# Patient Record
Sex: Female | Born: 1985 | Race: White | Hispanic: No | Marital: Married | Smoking: Current every day smoker
Health system: Southern US, Community
[De-identification: ages and names within clinical notes are randomized; demographics above are authoritative.]

## PROBLEM LIST (undated history)

## (undated) DIAGNOSIS — Z8619 Personal history of other infectious and parasitic diseases: Secondary | ICD-10-CM

## (undated) HISTORY — DX: Personal history of other infectious and parasitic diseases: Z86.19

## (undated) HISTORY — PX: MOUTH SURGERY: SHX715

---
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
## 2008-11-07 ENCOUNTER — Inpatient Hospital Stay (HOSPITAL_COMMUNITY): Admission: AD | Admit: 2008-11-07 | Discharge: 2008-11-07 | Payer: Self-pay | Admitting: Obstetrics and Gynecology

## 2008-11-08 ENCOUNTER — Inpatient Hospital Stay (HOSPITAL_COMMUNITY): Admission: AD | Admit: 2008-11-08 | Discharge: 2008-11-10 | Payer: Self-pay | Admitting: *Deleted

## 2010-04-18 LAB — CBC
HCT: 35.2 % — ABNORMAL LOW (ref 36.0–46.0)
Hemoglobin: 13.1 g/dL (ref 12.0–15.0)
MCHC: 34.7 g/dL (ref 30.0–36.0)
MCV: 95.3 fL (ref 78.0–100.0)
MCV: 95.8 fL (ref 78.0–100.0)
Platelets: 223 10*3/uL (ref 150–400)
RBC: 3.96 MIL/uL (ref 3.87–5.11)
RDW: 13.2 % (ref 11.5–15.5)

## 2010-08-12 DIAGNOSIS — Z30431 Encounter for routine checking of intrauterine contraceptive device: Secondary | ICD-10-CM

## 2010-11-26 LAB — OB RESULTS CONSOLE ANTIBODY SCREEN: Antibody Screen: NEGATIVE

## 2010-11-26 LAB — OB RESULTS CONSOLE HEPATITIS B SURFACE ANTIGEN: Hepatitis B Surface Ag: NEGATIVE

## 2010-11-26 LAB — OB RESULTS CONSOLE ABO/RH: RH Type: POSITIVE

## 2010-11-26 LAB — OB RESULTS CONSOLE RUBELLA ANTIBODY, IGM: Rubella: IMMUNE

## 2010-11-26 LAB — OB RESULTS CONSOLE RPR: RPR: NONREACTIVE

## 2011-01-14 NOTE — L&D Delivery Note (Signed)
Delivery Note At 4:54 AM a viable female was delivered via Vaginal, Spontaneous Delivery via OA  APGAR: 8 9    Placenta status: intact with 3 vessel cord  Cord:  with the following complications: tight nuchal cord x 1 Cord pH: not obtained  Anesthesia: Epidural  Episiotomy: None Lacerations: None Suture Repair: none Est. Blood Loss (mL): 300  Mom to postpartum.  Baby to nursery-stable.  Kailene Steinhart L 06/05/2011, 5:00 AM

## 2011-06-03 ENCOUNTER — Telehealth (HOSPITAL_COMMUNITY): Payer: Self-pay | Admitting: *Deleted

## 2011-06-03 ENCOUNTER — Encounter (HOSPITAL_COMMUNITY): Payer: Self-pay | Admitting: *Deleted

## 2011-06-03 NOTE — Telephone Encounter (Signed)
Preadmission screen  

## 2011-06-05 ENCOUNTER — Encounter (HOSPITAL_COMMUNITY): Payer: Self-pay | Admitting: *Deleted

## 2011-06-05 ENCOUNTER — Inpatient Hospital Stay (HOSPITAL_COMMUNITY): Admission: RE | Admit: 2011-06-05 | Payer: Medicaid Other | Source: Ambulatory Visit

## 2011-06-05 ENCOUNTER — Inpatient Hospital Stay (HOSPITAL_COMMUNITY): Payer: Medicaid Other | Admitting: Anesthesiology

## 2011-06-05 ENCOUNTER — Inpatient Hospital Stay (HOSPITAL_COMMUNITY)
Admission: AD | Admit: 2011-06-05 | Discharge: 2011-06-07 | DRG: 775 | Disposition: A | Discharge status: Home / Self Care | Payer: Medicaid Other | Source: Ambulatory Visit | Attending: Obstetrics and Gynecology | Admitting: Obstetrics and Gynecology

## 2011-06-05 ENCOUNTER — Encounter (HOSPITAL_COMMUNITY): Payer: Self-pay | Admitting: Anesthesiology

## 2011-06-05 LAB — CBC
MCHC: 34.9 g/dL (ref 30.0–36.0)
MCV: 92.1 fL (ref 78.0–100.0)
Platelets: 238 10*3/uL (ref 150–400)
RDW: 12.9 % (ref 11.5–15.5)
WBC: 14.7 10*3/uL — ABNORMAL HIGH (ref 4.0–10.5)

## 2011-06-05 MED ORDER — MEDROXYPROGESTERONE ACETATE 150 MG/ML IM SUSP: 150.0000 mg | INTRAMUSCULAR | Status: DC | PRN

## 2011-06-05 MED ORDER — FENTANYL 2.5 MCG/ML BUPIVACAINE 1/10 % EPIDURAL INFUSION (WH - ANES)
14.0000 mL/h | INTRAMUSCULAR | Status: DC
  Filled 2011-06-05: qty 60

## 2011-06-05 MED ORDER — ACETAMINOPHEN 325 MG PO TABS: 650.0000 mg | ORAL_TABLET | ORAL | Status: DC | PRN

## 2011-06-05 MED ORDER — IBUPROFEN 600 MG PO TABS
600.0000 mg | ORAL_TABLET | Freq: Four times a day (QID) | ORAL | Status: DC
  Administered 2011-06-05 – 2011-06-07 (×7): 600 mg via ORAL
  Filled 2011-06-05 (×7): qty 1

## 2011-06-05 MED ORDER — LACTATED RINGERS IV SOLN
INTRAVENOUS | Status: DC
  Administered 2011-06-05: 02:00:00 via INTRAVENOUS
  Administered 2011-06-05: 700 mL via INTRAVENOUS

## 2011-06-05 MED ORDER — ONDANSETRON HCL 4 MG PO TABS: 4.0000 mg | ORAL_TABLET | ORAL | Status: DC | PRN

## 2011-06-05 MED ORDER — SIMETHICONE 80 MG PO CHEW: 80.0000 mg | CHEWABLE_TABLET | ORAL | Status: DC | PRN

## 2011-06-05 MED ORDER — OXYCODONE-ACETAMINOPHEN 5-325 MG PO TABS: 1.0000 | ORAL_TABLET | ORAL | Status: DC | PRN

## 2011-06-05 MED ORDER — DIPHENHYDRAMINE HCL 25 MG PO CAPS: 25.0000 mg | ORAL_CAPSULE | Freq: Four times a day (QID) | ORAL | Status: DC | PRN

## 2011-06-05 MED ORDER — IBUPROFEN 600 MG PO TABS: 600.0000 mg | ORAL_TABLET | Freq: Four times a day (QID) | ORAL | Status: DC | PRN

## 2011-06-05 MED ORDER — SENNOSIDES-DOCUSATE SODIUM 8.6-50 MG PO TABS
2.0000 | ORAL_TABLET | Freq: Every day | ORAL | Status: DC
  Administered 2011-06-05 – 2011-06-06 (×2): 2 via ORAL

## 2011-06-05 MED ORDER — DIBUCAINE 1 % RE OINT: 1.0000 "application " | TOPICAL_OINTMENT | RECTAL | Status: DC | PRN

## 2011-06-05 MED ORDER — LIDOCAINE HCL (PF) 1 % IJ SOLN
30.0000 mL | INTRAMUSCULAR | Status: DC | PRN
  Filled 2011-06-05: qty 30

## 2011-06-05 MED ORDER — OXYCODONE-ACETAMINOPHEN 5-325 MG PO TABS
1.0000 | ORAL_TABLET | ORAL | Status: DC | PRN
  Administered 2011-06-05 – 2011-06-06 (×3): 1 via ORAL
  Filled 2011-06-05 (×2): qty 1

## 2011-06-05 MED ORDER — CITRIC ACID-SODIUM CITRATE 334-500 MG/5ML PO SOLN: 30.0000 mL | ORAL | Status: DC | PRN

## 2011-06-05 MED ORDER — BISACODYL 10 MG RE SUPP: 10.0000 mg | Freq: Every day | RECTAL | Status: DC | PRN

## 2011-06-05 MED ORDER — EPHEDRINE 5 MG/ML INJ
10.0000 mg | INTRAVENOUS | Status: DC | PRN
  Filled 2011-06-05: qty 4

## 2011-06-05 MED ORDER — LACTATED RINGERS IV SOLN: 500.0000 mL | Freq: Once | INTRAVENOUS | Status: DC

## 2011-06-05 MED ORDER — SODIUM BICARBONATE 8.4 % IV SOLN
INTRAVENOUS | Status: DC | PRN
  Administered 2011-06-05: 4 mL via EPIDURAL

## 2011-06-05 MED ORDER — FLEET ENEMA 7-19 GM/118ML RE ENEM: 1.0000 | ENEMA | Freq: Every day | RECTAL | Status: DC | PRN

## 2011-06-05 MED ORDER — ONDANSETRON HCL 4 MG/2ML IJ SOLN: 4.0000 mg | INTRAMUSCULAR | Status: DC | PRN

## 2011-06-05 MED ORDER — PRENATAL MULTIVITAMIN CH
1.0000 | ORAL_TABLET | Freq: Every day | ORAL | Status: DC
  Administered 2011-06-05 – 2011-06-06 (×2): 1 via ORAL
  Filled 2011-06-05 (×2): qty 1

## 2011-06-05 MED ORDER — WITCH HAZEL-GLYCERIN EX PADS: 1.0000 "application " | MEDICATED_PAD | CUTANEOUS | Status: DC | PRN

## 2011-06-05 MED ORDER — PHENYLEPHRINE 40 MCG/ML (10ML) SYRINGE FOR IV PUSH (FOR BLOOD PRESSURE SUPPORT)
80.0000 ug | PREFILLED_SYRINGE | INTRAVENOUS | Status: DC | PRN
  Filled 2011-06-05: qty 5

## 2011-06-05 MED ORDER — MEASLES, MUMPS & RUBELLA VAC ~~LOC~~ INJ
0.5000 mL | INJECTION | Freq: Once | SUBCUTANEOUS | Status: DC
  Filled 2011-06-05: qty 0.5

## 2011-06-05 MED ORDER — OXYTOCIN 20 UNITS IN LACTATED RINGERS INFUSION - SIMPLE: 125.0000 mL/h | Freq: Once | INTRAVENOUS | Status: DC

## 2011-06-05 MED ORDER — EPHEDRINE 5 MG/ML INJ: 10.0000 mg | INTRAVENOUS | Status: DC | PRN

## 2011-06-05 MED ORDER — ONDANSETRON HCL 4 MG/2ML IJ SOLN: 4.0000 mg | Freq: Four times a day (QID) | INTRAMUSCULAR | Status: DC | PRN

## 2011-06-05 MED ORDER — PHENYLEPHRINE 40 MCG/ML (10ML) SYRINGE FOR IV PUSH (FOR BLOOD PRESSURE SUPPORT): 80.0000 ug | PREFILLED_SYRINGE | INTRAVENOUS | Status: DC | PRN

## 2011-06-05 MED ORDER — OXYTOCIN BOLUS FROM INFUSION
500.0000 mL | Freq: Once | INTRAVENOUS | Status: DC
  Filled 2011-06-05: qty 500
  Filled 2011-06-05: qty 1000

## 2011-06-05 MED ORDER — DIPHENHYDRAMINE HCL 50 MG/ML IJ SOLN: 12.5000 mg | INTRAMUSCULAR | Status: DC | PRN

## 2011-06-05 MED ORDER — TETANUS-DIPHTH-ACELL PERTUSSIS 5-2.5-18.5 LF-MCG/0.5 IM SUSP
0.5000 mL | Freq: Once | INTRAMUSCULAR | Status: DC
  Filled 2011-06-05: qty 0.5

## 2011-06-05 MED ORDER — ZOLPIDEM TARTRATE 5 MG PO TABS: 5.0000 mg | ORAL_TABLET | Freq: Every evening | ORAL | Status: DC | PRN

## 2011-06-05 MED ORDER — LACTATED RINGERS IV SOLN: 500.0000 mL | INTRAVENOUS | Status: DC | PRN

## 2011-06-05 MED ORDER — FENTANYL 2.5 MCG/ML BUPIVACAINE 1/10 % EPIDURAL INFUSION (WH - ANES)
INTRAMUSCULAR | Status: DC | PRN
  Administered 2011-06-05: 14 mL/h via EPIDURAL

## 2011-06-05 MED ORDER — BENZOCAINE-MENTHOL 20-0.5 % EX AERO: 1.0000 "application " | INHALATION_SPRAY | CUTANEOUS | Status: DC | PRN

## 2011-06-05 MED ORDER — FLEET ENEMA 7-19 GM/118ML RE ENEM: 1.0000 | ENEMA | RECTAL | Status: DC | PRN

## 2011-06-05 MED ORDER — LANOLIN HYDROUS EX OINT: TOPICAL_OINTMENT | CUTANEOUS | Status: DC | PRN

## 2011-06-05 NOTE — Anesthesia Postprocedure Evaluation (Signed)
  Anesthesia Post-op Note  Patient: Megan Villanueva  Procedure(s) Performed: * No procedures listed *  Patient Location: [facility]  Anesthesia Type: Epidural  Level of Consciousness: awake  Airway and Oxygen Therapy: Patient Spontanous Breathing  Post-op Pain: none  Post-op Assessment: Patient's Cardiovascular Status Stable, Respiratory Function Stable, Patent Airway, No signs of Nausea or vomiting, Adequate PO intake, Pain level controlled, No headache, No backache, No residual numbness and No residual motor weakness  Post-op Vital Signs: Reviewed and stable  Complications: No apparent anesthesia complications

## 2011-06-05 NOTE — Anesthesia Procedure Notes (Signed)

## 2011-06-05 NOTE — MAU Note (Signed)
Pt G2P1 at 39.5wks contracting every 2-30min.  Denies any leaking, bleeding or problems with pregnancy.

## 2011-06-05 NOTE — Progress Notes (Signed)
UR Chart review completed.  

## 2011-06-05 NOTE — Anesthesia Preprocedure Evaluation (Signed)

## 2011-06-05 NOTE — H&P (Signed)
26 year old G 2 P 1001 at 30 w 5 days presented in active labor History of NSVD x 1 GBBS is negative PNC see [facility]  Afebrile  Vital signs stable General alert and oriented Lung CTAB Car RRR Abd Gravid Cervix 100%/10 +1 arom Clear fluid Vertex  IMPRESSION: Labor PLAN: NSVD

## 2011-06-06 LAB — CBC
HCT: 37.8 % (ref 36.0–46.0)
MCH: 31.3 pg (ref 26.0–34.0)
MCHC: 33.3 g/dL (ref 30.0–36.0)
MCV: 94 fL (ref 78.0–100.0)
Platelets: 207 10*3/uL (ref 150–400)
RDW: 13.2 % (ref 11.5–15.5)
WBC: 11.1 10*3/uL — ABNORMAL HIGH (ref 4.0–10.5)

## 2011-06-06 NOTE — Progress Notes (Signed)
Post Partum Day one Subjective: no complaints  Objective: Blood pressure 110/74, pulse 70, temperature 97.6 F (36.4 C), temperature source Oral, resp. rate 16, height 5\' 2"  (1.575 m), weight 80.287 kg (177 lb), last menstrual period 08/31/2010, SpO2 97.00%, unknown if currently breastfeeding.  Physical Exam:  General: alert Lochia: appropriate Uterine Fundus: firm Incision: healing well DVT Evaluation: No evidence of DVT seen on physical exam.   Basename 06/06/11 0455 06/05/11 0216  HGB 12.6 13.0  HCT 37.8 37.2    Assessment/Plan: Plan for discharge tomorrow   LOS: 1 day   Grayce Budden S 06/06/2011, 7:31 AM

## 2011-06-07 MED ORDER — PNEUMOCOCCAL VAC POLYVALENT 25 MCG/0.5ML IJ INJ
0.5000 mL | INJECTION | INTRAMUSCULAR | Status: AC
  Administered 2011-06-07: 0.5 mL via INTRAMUSCULAR
  Filled 2011-06-07: qty 0.5

## 2011-06-07 NOTE — Progress Notes (Signed)
Post Partum Day two Subjective: no complaints  Objective: Blood pressure 105/71, pulse 74, temperature 98.1 F (36.7 C), temperature source Oral, resp. rate 18, height 5\' 2"  (1.575 m), weight 80.287 kg (177 lb), last menstrual period 08/31/2010, SpO2 98.00%, unknown if currently breastfeeding.  Physical Exam:  General: alert Lochia: appropriate Uterine Fundus: firm Incision: healing well DVT Evaluation: No evidence of DVT seen on physical exam.   Basename 06/06/11 0455 06/05/11 0216  HGB 12.6 13.0  HCT 37.8 37.2    Assessment/Plan: Plan for discharge tomorrow and Discharge home   LOS: 2 days   Ashlon Lottman S 06/07/2011, 7:22 AM

## 2011-06-07 NOTE — Progress Notes (Signed)
Pt Dc'd to home with baby and family at 89.  DC instructions given and pt verbalized understanding of all. Pt stable and ambulated at her request with Loistine Chance NT.

## 2011-06-07 NOTE — Discharge Instructions (Signed)
Vaginal Delivery Care After  Change your pad on each trip to the bathroom.   Wipe gently with toilet paper during your hospital stay. Always wipe from front to back. A spray bottle with warm tap water could also be used or a towelette if available.   Place your soiled pad and toilet paper in a bathroom wastebasket with a plastic bag liner.   During your hospital stay, save any clots. If you pass a clot while on the toilet, do not flush it. Also, if your vaginal flow seems excessive to you, notify nursing personnel.   The first time you get out of bed after delivery, wait for assistance from a nurse. Do not get up alone at any time if you feel weak or dizzy.   Bend and extend your ankles forcefully so that you feel the calves of your legs get hard. Do this 6 times every hour when you are in bed and awake.   Do not sit with one foot under you, dangle your legs over the edge of the bed, or maintain a position that hinders the circulation in your legs.   Many women experience after pains for 2 to 3 days after delivery. These after pains are mild uterine contractions. Ask the nurse for a pain medication if you need something for this. Sometimes breastfeeding stimulates after pains; if you find this to be true, ask for the medication  -  hour before the next feeding.   For you and your infant's protection, do not go beyond the door(s) of the obstetric unit. Do not carry your baby in your arms in the hallway. When taking your baby to and from your room, put your baby in the bassinet and push the bassinet.   Mothers may have their babies in their room as much as they desire.  Document Released: 12/28/1999 Document Revised: 12/19/2010 Document Reviewed: 11/27/2006 Exit[employer] Patient Information 2012 [employer]. 

## 2011-06-07 NOTE — Discharge Summary (Signed)
Obstetric Discharge Summary Reason for Admission: onset of labor Prenatal Procedures: none Intrapartum Procedures: spontaneous vaginal delivery Postpartum Procedures: none Complications-Operative and Postpartum: none Hemoglobin  Date Value Range Status  06/06/2011 12.6  12.0-15.0 (g/dL) Final     HCT  Date Value Range Status  06/06/2011 37.8  36.0-46.0 (%) Final    Physical Exam:  General: alert Lochia: appropriate Uterine Fundus: firm Incision: healing well DVT Evaluation: No evidence of DVT seen on physical exam.  Discharge Diagnoses: Term Pregnancy-delivered  Discharge Information: Date: 06/07/2011 Activity: pelvic rest Diet: routine Medications: PNV Condition: stable Instructions: refer to practice specific booklet Discharge to: home   Newborn Data: Live born female  Birth Weight: 5 lb 11.4 oz (2590 g) APGAR: 8, 9  Home with mother.  Yzabella Crunk S 06/07/2011, 7:23 AM

## 2013-11-14 ENCOUNTER — Encounter (HOSPITAL_COMMUNITY): Payer: Self-pay | Admitting: *Deleted

## 2015-06-06 ENCOUNTER — Ambulatory Visit (HOSPITAL_COMMUNITY)
Admission: EM | Admit: 2015-06-06 | Discharge: 2015-06-06 | Disposition: A | Discharge status: Home / Self Care | Payer: Medicaid Other | Attending: Family Medicine | Admitting: Family Medicine

## 2015-06-06 ENCOUNTER — Encounter (HOSPITAL_COMMUNITY): Payer: Self-pay | Admitting: Emergency Medicine

## 2015-06-06 DIAGNOSIS — T148 Other injury of unspecified body region: Secondary | ICD-10-CM | POA: Diagnosis not present

## 2015-06-06 DIAGNOSIS — W57XXXA Bitten or stung by nonvenomous insect and other nonvenomous arthropods, initial encounter: Secondary | ICD-10-CM | POA: Diagnosis not present

## 2015-06-06 MED ORDER — FLUTICASONE PROPIONATE 0.05 % EX CREA: TOPICAL_CREAM | Freq: Two times a day (BID) | CUTANEOUS | Status: DC

## 2015-06-06 NOTE — ED Notes (Signed)
Rash/sores to face and right forearm, noticed 3-4 days ago.  Reports daughter treated for impetigo 2 weeks .  Reports rash is itchy

## 2015-06-06 NOTE — ED Provider Notes (Signed)
CSN: [id number]     Arrival date & time 06/06/15  1258 History   First MD Initiated Contact with Patient 06/06/15 1330     Chief Complaint  Patient presents with  . Rash   (Consider location/radiation/quality/duration/timing/severity/associated sxs/prior Treatment) Patient is a 30 y.o. female presenting with rash. The history is provided by the patient.  Rash Location:  Shoulder/arm and face Facial rash location:  Face Shoulder/arm rash location:  R forearm Severity:  Mild Onset quality:  Sudden Duration:  3 days Progression:  Unchanged Chronicity:  New Context: insect bite/sting   Relieved by:  None tried Worsened by:  Nothing tried Ineffective treatments:  None tried Associated symptoms: no fever     Past Medical History  Diagnosis Date  . History of chicken pox    Past Surgical History  Procedure Laterality Date  . Mouth surgery     Family History  Problem Relation Age of Onset  . Diabetes Maternal Grandmother    Social History  Substance Use Topics  . Smoking status: Current Every Day Smoker -- 0.30 packs/day  . Smokeless tobacco: Never Used  . Alcohol Use: No   OB History    Gravida Para Term Preterm AB TAB SAB Ectopic Multiple Living   2 2 2       2      Review of Systems  Constitutional: Negative.  Negative for fever.  Skin: Positive for rash.  All other systems reviewed and are negative.   Allergies  Review of patient's allergies indicates no known allergies.  Home Medications   Prior to Admission medications   Medication Sig Start Date End Date Taking? Authorizing Provider  fluticasone (CUTIVATE) 0.05 % cream Apply topically 2 (two) times daily. Sparingly. 06/06/15   Billy Fischer, MD  Prenatal Vit-Fe Fumarate-FA (PRENATAL MULTIVITAMIN) TABS Take 1 tablet by mouth daily.    Historical Provider, MD   Meds Ordered and Administered this Visit  Medications - No data to display  BP 125/98 mmHg  Pulse 92  Temp(Src) 98.2 F (36.8 C)  Resp 16   SpO2 99% No data found.   Physical Exam  Constitutional: She is oriented to person, place, and time. She appears well-developed and well-nourished. No distress.  Neurological: She is alert and oriented to person, place, and time.  Skin: Skin is warm and dry. Rash noted. There is erythema.  Papular excoriated rash on right cheek and right forearm, no infection.  Nursing note and vitals reviewed.   ED Course  Procedures (including critical care time)  Labs Review Labs Reviewed - No data to display  Imaging Review No results found.   Visual Acuity Review  Right Eye Distance:   Left Eye Distance:   Bilateral Distance:    Right Eye Near:   Left Eye Near:    Bilateral Near:         MDM   1. Multiple insect bites        Billy Fischer, MD 06/06/15 1336

## 2015-06-13 ENCOUNTER — Encounter (HOSPITAL_COMMUNITY): Payer: Self-pay | Admitting: Emergency Medicine

## 2015-06-13 ENCOUNTER — Ambulatory Visit (HOSPITAL_COMMUNITY)
Admission: EM | Admit: 2015-06-13 | Discharge: 2015-06-13 | Disposition: A | Discharge status: Home / Self Care | Payer: Medicaid Other | Attending: Emergency Medicine | Admitting: Emergency Medicine

## 2015-06-13 DIAGNOSIS — H6091 Unspecified otitis externa, right ear: Secondary | ICD-10-CM

## 2015-06-13 DIAGNOSIS — L0100 Impetigo, unspecified: Secondary | ICD-10-CM | POA: Diagnosis not present

## 2015-06-13 MED ORDER — NEOMYCIN-POLYMYXIN-HC 3.5-10000-1 OT SUSP: 4.0000 [drp] | Freq: Four times a day (QID) | OTIC | Status: DC

## 2015-06-13 MED ORDER — MUPIROCIN 2 % EX OINT: 1.0000 "application " | TOPICAL_OINTMENT | Freq: Two times a day (BID) | CUTANEOUS | Status: DC

## 2015-06-13 NOTE — Discharge Instructions (Signed)
Otitis Externa Otitis externa is a germ infection in the outer ear. The outer ear is the area from the eardrum to the outside of the ear. Otitis externa is sometimes called "swimmer's ear." HOME CARE  Put drops in the ear as told by your doctor.  Only take medicine as told by your doctor.  If you have diabetes, your doctor may give you more directions. Follow your doctor's directions.  Keep all doctor visits as told. To avoid another infection:  Keep your ear dry. Use the corner of a towel to dry your ear after swimming or bathing.  Avoid scratching or putting things inside your ear.  Avoid swimming in lakes, dirty water, or pools that use a chemical called chlorine poorly.  You may use ear drops after swimming. Combine equal amounts of white vinegar and alcohol in a bottle. Put 3 or 4 drops in each ear. GET HELP IF:   You have a fever.  Your ear is still red, puffy (swollen), or painful after 3 days.  You still have yellowish-white fluid (pus) coming from the ear after 3 days.  Your redness, puffiness, or pain gets worse.  You have a really bad headache.  You have redness, puffiness, pain, or tenderness behind your ear. MAKE SURE YOU:   Understand these instructions.  Will watch your condition.  Will get help right away if you are not doing well or get worse.   This information is not intended to replace advice given to you by your health care provider. Make sure you discuss any questions you have with your health care provider.   Document Released: 06/18/2007 Document Revised: 01/20/2014 Document Reviewed: 01/16/2011 Elsevier Interactive Patient Education 2016 [employer].  Use the mupirocin ointment twice a day on the rash.  Follow-up as needed.

## 2015-06-13 NOTE — ED Notes (Signed)
PT reports her right ear is very painful and she suspects it is infected. PT reports symptoms started four days ago. PT reports she believes that ear is leaking clear fluid. PT denies any recent head injury.

## 2015-06-13 NOTE — ED Provider Notes (Signed)
CSN: [id number]     Arrival date & time 06/13/15  1257 History   First MD Initiated Contact with Patient 06/13/15 1313     Chief Complaint  Patient presents with  . Otalgia   (Consider location/radiation/quality/duration/timing/severity/associated sxs/prior Treatment) HPI She is a 30 year old woman here for evaluation of right ear pain. She states this started about 4 days ago. It has gradually been getting worse. Yesterday and today she has noticed some drainage from the right ear. There is some associated nasal congestion.  No cough or fevers. No decreased hearing.  She also reports a continued rash for a week ago. She's been using a steroid cream on it with some improvement. The lesions are itchy and some of them have crusting.  Past Medical History  Diagnosis Date  . History of chicken pox    Past Surgical History  Procedure Laterality Date  . Mouth surgery     Family History  Problem Relation Age of Onset  . Diabetes Maternal Grandmother    Social History  Substance Use Topics  . Smoking status: Current Every Day Smoker -- 1.00 packs/day  . Smokeless tobacco: Never Used  . Alcohol Use: Yes     Comment: social   OB History    Gravida Para Term Preterm AB TAB SAB Ectopic Multiple Living   2 2 2       2      Review of Systems As in history of present illness Allergies  Review of patient's allergies indicates no known allergies.  Home Medications   Prior to Admission medications   Medication Sig Start Date End Date Taking? Authorizing Provider  fluticasone (CUTIVATE) 0.05 % cream Apply topically 2 (two) times daily. Sparingly. 06/06/15  Yes Billy Fischer, MD  mupirocin ointment (BACTROBAN) 2 % Apply 1 application topically 2 (two) times daily. 06/13/15   Melony Overly, MD  neomycin-polymyxin-hydrocortisone (CORTISPORIN) 3.5-10000-1 otic suspension Place 4 drops into the right ear 4 (four) times daily. for 1 week 06/13/15   Melony Overly, MD   Meds Ordered and Administered  this Visit  Medications - No data to display  BP 141/93 mmHg  Pulse 98  Temp(Src) 98.6 F (37 C) (Oral)  Resp 16  SpO2 100% No data found.   Physical Exam  Constitutional: She is oriented to person, place, and time. She appears well-developed and well-nourished. No distress.  HENT:  Left ear canal and TM are normal. Right ear canal is swollen and erythematous with copious purulent drainage. After irrigation, TM is normal.  Neck: Neck supple.  Cardiovascular: Normal rate.   Pulmonary/Chest: Effort normal.  Neurological: She is alert and oriented to person, place, and time.  Skin: Rash (multiple papular lesions on the face. Several of these have developed a honey-colored crusting.) noted.    ED Course  Procedures (including critical care time)  Labs Review Labs Reviewed - No data to display  Imaging Review No results found.   MDM   1. Right otitis externa   2. Impetigo    Cortisporin drops for the ear infection. Mupirocin for impetigo. Follow-up if not improving in 2 days.    Melony Overly, MD 06/13/15 1352

## 2015-08-03 ENCOUNTER — Encounter (HOSPITAL_COMMUNITY): Payer: Self-pay | Admitting: Emergency Medicine

## 2015-08-03 ENCOUNTER — Ambulatory Visit (HOSPITAL_COMMUNITY)
Admission: EM | Admit: 2015-08-03 | Discharge: 2015-08-03 | Disposition: A | Discharge status: Home / Self Care | Payer: Medicaid Other | Attending: Family Medicine | Admitting: Family Medicine

## 2015-08-03 DIAGNOSIS — L03211 Cellulitis of face: Secondary | ICD-10-CM

## 2015-08-03 MED ORDER — SULFAMETHOXAZOLE-TRIMETHOPRIM 800-160 MG PO TABS: 1.0000 | ORAL_TABLET | Freq: Two times a day (BID) | ORAL | Status: AC

## 2015-08-03 NOTE — ED Provider Notes (Signed)
CSN: [id number]     Arrival date & time 08/03/15  1251 History   None    Chief Complaint  Patient presents with  . Rash   (Consider location/radiation/quality/duration/timing/severity/associated sxs/prior Treatment) Patient is a 30 y.o. female presenting with rash. The history is provided by the patient.  Rash Location:  Face (right inner eye ) Facial rash location:  Face Quality: redness and swelling   Severity:  Mild Onset quality:  Gradual Duration:  6 days Timing:  Constant Progression:  Spreading Chronicity:  New Relieved by:  Nothing Ineffective treatments:  None tried   Past Medical History  Diagnosis Date  . History of chicken pox    Past Surgical History  Procedure Laterality Date  . Mouth surgery     Family History  Problem Relation Age of Onset  . Diabetes Maternal Grandmother    Social History  Substance Use Topics  . Smoking status: Current Every Day Smoker -- 1.00 packs/day  . Smokeless tobacco: Never Used  . Alcohol Use: Yes     Comment: social   OB History    Gravida Para Term Preterm AB TAB SAB Ectopic Multiple Living   2 2 2       2      Review of Systems  Constitutional: Negative.   HENT: Negative.   Eyes: Negative.   Respiratory: Negative.   Cardiovascular: Negative.   Gastrointestinal: Negative.   Endocrine: Negative.   Genitourinary: Negative.   Musculoskeletal: Negative.   Skin: Positive for rash.  Allergic/Immunologic: Negative.   Neurological: Negative.   Hematological: Negative.   Psychiatric/Behavioral: Negative.     Allergies  Review of patient's allergies indicates no known allergies.  Home Medications   Prior to Admission medications   Medication Sig Start Date End Date Taking? Authorizing Provider  fluticasone (CUTIVATE) 0.05 % cream Apply topically 2 (two) times daily. Sparingly. 06/06/15   Billy Fischer, MD  mupirocin ointment (BACTROBAN) 2 % Apply 1 application topically 2 (two) times daily. 06/13/15   Melony Overly, MD  neomycin-polymyxin-hydrocortisone (CORTISPORIN) 3.5-10000-1 otic suspension Place 4 drops into the right ear 4 (four) times daily. for 1 week 06/13/15   Melony Overly, MD  sulfamethoxazole-trimethoprim (BACTRIM DS,SEPTRA DS) 800-160 MG tablet Take 1 tablet by mouth 2 (two) times daily. 08/03/15 08/10/15  Lysbeth Penner, FNP   Meds Ordered and Administered this Visit  Medications - No data to display  BP 109/75 mmHg  Pulse 93  Temp(Src) 98.3 F (36.8 C) (Oral)  Resp 18  SpO2 97% No data found.   Physical Exam  Constitutional: She is oriented to person, place, and time. She appears well-developed and well-nourished.  HENT:  Head: Normocephalic.  Right Ear: External ear normal.  Left Ear: External ear normal.  Mouth/Throat: Oropharynx is clear and moist.  Eyes: Conjunctivae and EOM are normal. Pupils are equal, round, and reactive to light.  Neck: Normal range of motion. Neck supple.  Cardiovascular: Normal rate, regular rhythm and normal heart sounds.   Pulmonary/Chest: Effort normal and breath sounds normal.  Abdominal: Soft. Bowel sounds are normal.  Neurological: She is alert and oriented to person, place, and time.  Skin: There is erythema.  Left perioribital area with erythema and tenderness.    ED Course  Procedures (including critical care time)  Labs Review Labs Reviewed - No data to display  Imaging Review No results found.   Visual Acuity Review  Right Eye Distance:   Left Eye Distance:   Bilateral  Distance:    Right Eye Near:   Left Eye Near:    Bilateral Near:         MDM   1. Cellulitis of face   Bactrim DS one po bid x 10 days #20      Lysbeth Penner, FNP 08/03/15 1521

## 2015-08-03 NOTE — ED Notes (Signed)
The patient presented to the [facility] with a complaint of a rash on her face x 6 days.

## 2017-09-30 ENCOUNTER — Emergency Department (HOSPITAL_COMMUNITY)
Admission: EM | Admit: 2017-09-30 | Discharge: 2017-09-30 | Disposition: A | Discharge status: Home / Self Care | Payer: Self-pay | Attending: Emergency Medicine | Admitting: Emergency Medicine

## 2017-09-30 ENCOUNTER — Emergency Department (HOSPITAL_COMMUNITY): Payer: Self-pay

## 2017-09-30 ENCOUNTER — Other Ambulatory Visit: Payer: Self-pay

## 2017-09-30 ENCOUNTER — Encounter (HOSPITAL_COMMUNITY): Payer: Self-pay

## 2017-09-30 DIAGNOSIS — F17210 Nicotine dependence, cigarettes, uncomplicated: Secondary | ICD-10-CM | POA: Insufficient documentation

## 2017-09-30 DIAGNOSIS — D75839 Thrombocytosis, unspecified: Secondary | ICD-10-CM

## 2017-09-30 DIAGNOSIS — R0789 Other chest pain: Secondary | ICD-10-CM | POA: Insufficient documentation

## 2017-09-30 DIAGNOSIS — R030 Elevated blood-pressure reading, without diagnosis of hypertension: Secondary | ICD-10-CM | POA: Insufficient documentation

## 2017-09-30 DIAGNOSIS — D473 Essential (hemorrhagic) thrombocythemia: Secondary | ICD-10-CM | POA: Insufficient documentation

## 2017-09-30 LAB — BASIC METABOLIC PANEL
Anion gap: 12 (ref 5–15)
BUN: 10 mg/dL (ref 6–20)
CALCIUM: 9.5 mg/dL (ref 8.9–10.3)
CO2: 24 mmol/L (ref 22–32)
CREATININE: 0.74 mg/dL (ref 0.44–1.00)
Chloride: 104 mmol/L (ref 98–111)
GFR calc non Af Amer: >60 mL/min (ref >60)
Glucose, Bld: 121 mg/dL — ABNORMAL HIGH (ref 70–99)
Potassium: 3.6 mmol/L (ref 3.5–5.1)
Sodium: 140 mmol/L (ref 135–145)

## 2017-09-30 LAB — CBC
HCT: 49.7 % — ABNORMAL HIGH (ref 36.0–46.0)
Hemoglobin: 17.7 g/dL — ABNORMAL HIGH (ref 12.0–15.0)
MCH: 35.7 pg — ABNORMAL HIGH (ref 26.0–34.0)
MCHC: 35.6 g/dL (ref 30.0–36.0)
MCV: 100.2 fL — ABNORMAL HIGH (ref 78.0–100.0)
PLATELETS: 286 10*3/uL (ref 150–400)
RBC: 4.96 MIL/uL (ref 3.87–5.11)
RDW: 12.3 % (ref 11.5–15.5)
WBC: 10.1 10*3/uL (ref 4.0–10.5)

## 2017-09-30 LAB — POCT I-STAT TROPONIN I: TROPONIN I, POC: 0 ng/mL (ref 0.00–0.08)

## 2017-09-30 LAB — I-STAT BETA HCG BLOOD, ED (NOT ORDERABLE): I-stat hCG, quantitative: <5 m[IU]/mL (ref <5)

## 2017-09-30 NOTE — ED Provider Notes (Signed)
[facility] DEPT Provider Note   CSN: [id number] Arrival date & time: 09/30/17  1658     History   Chief Complaint Chief Complaint  Patient presents with  . Chest Pain    HPI Megan Villanueva is a 32 y.o. female.  The history is provided by the patient. No language interpreter was used.  Chest Pain     Megan Villanueva is a 32 y.o. female who presents to the Emergency Department complaining of chest pain. She presents to the emergency department complaining of chest pain that began two days ago. Pain is located along her sternum and radiates around to both ribs. It is waxing and waning and described as a soreness and a bone type pain. It is better when she lays down and worse with movement. She denies any fevers, cough, shortness of breath, abdominal pain, nausea, vomiting, leg swelling or pain. No known injuries and no similar prior symptoms. She smokes a pack a day. She does have a Mirena IUD. She has no personal or family history of blood clots or cardiac disease. Past Medical History:  Diagnosis Date  . History of chicken pox     There are no active problems to display for this patient.   Past Surgical History:  Procedure Laterality Date  . MOUTH SURGERY       OB History    Gravida  2   Para  2   Term  2   Preterm      AB      Living  2     SAB      TAB      Ectopic      Multiple      Live Births  2            Home Medications    Prior to Admission medications   Medication Sig Start Date End Date Taking? Authorizing Provider  acetaminophen (TYLENOL) 500 MG tablet Take 500 mg by mouth daily as needed (pain).   Yes [provider]  ibuprofen (ADVIL,MOTRIN) 200 MG tablet Take 200 mg by mouth daily as needed (chest pain).   Yes [provider]  ranitidine (ZANTAC) 75 MG tablet Take 150 mg by mouth daily as needed for heartburn.   Yes [provider]  fluticasone (CUTIVATE) 0.05 % cream  Apply topically 2 (two) times daily. Sparingly. Patient not taking: Reported on 09/30/2017 06/06/15   Billy Fischer, MD  mupirocin ointment (BACTROBAN) 2 % Apply 1 application topically 2 (two) times daily. Patient not taking: Reported on 09/30/2017 06/13/15   Melony Overly, MD  neomycin-polymyxin-hydrocortisone (CORTISPORIN) 3.5-10000-1 otic suspension Place 4 drops into the right ear 4 (four) times daily. for 1 week Patient not taking: Reported on 09/30/2017 06/13/15   Melony Overly, MD    Family History Family History  Problem Relation Age of Onset  . Diabetes Maternal Grandmother     Social History Social History   Tobacco Use  . Smoking status: Current Every Day Smoker    Packs/day: 1.00  . Smokeless tobacco: Never Used  Substance Use Topics  . Alcohol use: Yes    Comment: social  . Drug use: No     Allergies   Patient has no known allergies.   Review of Systems Review of Systems  Cardiovascular: Positive for chest pain.  All other systems reviewed and are negative.    Physical Exam Updated Vital Signs BP (!) 142/105 (BP Location: Left Arm)  Pulse 72   Temp 98 F (36.7 C) (Oral)   Resp 16   Ht 5\' 3"  (1.6 m)   Wt 74.8 kg   SpO2 100%   BMI 29.23 kg/m   Physical Exam  Constitutional: She is oriented to person, place, and time. She appears well-developed and well-nourished.  HENT:  Head: Normocephalic and atraumatic.  Cardiovascular: Normal rate and regular rhythm.  No murmur heard. Pulmonary/Chest: Effort normal and breath sounds normal. No respiratory distress.  Abdominal: Soft. There is no tenderness. There is no rebound and no guarding.  Musculoskeletal: She exhibits no edema or tenderness.  Neurological: She is alert and oriented to person, place, and time.  Skin: Skin is warm and dry.  Psychiatric: She has a normal mood and affect. Her behavior is normal.  Nursing note and vitals reviewed.    ED Treatments / Results  Labs (all labs ordered are  listed, but only abnormal results are displayed) Labs Reviewed  BASIC METABOLIC PANEL - Abnormal; Notable for the following components:      Result Value   Glucose, Bld 121 (*)    All other components within normal limits  CBC - Abnormal; Notable for the following components:   Hemoglobin 17.7 (*)    HCT 49.7 (*)    MCV 100.2 (*)    MCH 35.7 (*)    All other components within normal limits  I-STAT TROPONIN, ED  I-STAT BETA HCG BLOOD, ED ([facility], [facility], [facility] ONLY)  POCT I-STAT TROPONIN I  I-STAT BETA HCG BLOOD, ED (NOT ORDERABLE)    EKG EKG Interpretation  Date/Time:  Wednesday September 30 2017 20:16:50 EDT Ventricular Rate:  73 PR Interval:    QRS Duration: 96 QT Interval:  400 QTC Calculation: 441 R Axis:   58 Text Interpretation:  Sinus rhythm Low voltage, precordial leads Confirmed by Quintella Reichert [phone]) on 09/30/2017 8:18:57 PM   Radiology Dg Chest 2 View  Result Date: 09/30/2017 CLINICAL DATA:  Chest pain EXAM: CHEST - 2 VIEW COMPARISON:  None. FINDINGS: The heart size and mediastinal contours are within normal limits. Both lungs are clear. The visualized skeletal structures are unremarkable. IMPRESSION: No active cardiopulmonary disease. Electronically Signed   By: Donavan Foil M.D.   On: 09/30/2017 17:50    Procedures Procedures (including critical care time)  Medications Ordered in ED Medications - No data to display   Initial Impression / Assessment and Plan / ED Course  I have reviewed the triage vital signs and the nursing notes.  Pertinent labs & imaging results that were available during my care of the patient were reviewed by me and considered in my medical decision making (see chart for details).     Patient here for evaluation of two days of chest pain. She is well appearing on examination and in no acute distress. Presentation is not consistent with ACS, PE, pneumonia, dissection. She was hypotensive on ED arrival, improved on recheck of her blood  pressure. EKG without acute ischemic changes. CBC with thrombocytosis - question possibly related to tobacco use. Discussed with patient home care for atypical chest pain - musculoskeletal versus reflux related. Discussed smoking sensation. Discussed outpatient follow-up and return precautions.  Final Clinical Impressions(s) / ED Diagnoses   Final diagnoses:  Atypical chest pain  Thrombocytosis (HCC)  Elevated blood pressure reading without diagnosis of hypertension    ED Discharge Orders    None       Quintella Reichert, MD 09/30/17 2041

## 2017-09-30 NOTE — Discharge Instructions (Addendum)
Your red blood cell count was high today. This can happen from blood issues as well as from smoking. Please try to stop smoking. Please follow-up with your doctor for recheck. Get rechecked immediately if you have any new or concerning symptoms. You can take omeprazole, available over-the-counter for indigestion.

## 2017-09-30 NOTE — ED Triage Notes (Signed)
Pt states that for several days, she has had chest pain, mostly in her sternal area, slightly radiating to her back. Pt describes the pain as annoying and scary.

## 2017-10-16 ENCOUNTER — Emergency Department (HOSPITAL_COMMUNITY)
Admission: EM | Admit: 2017-10-16 | Discharge: 2017-10-16 | Disposition: A | Discharge status: Home / Self Care | Payer: Self-pay | Attending: Emergency Medicine | Admitting: Emergency Medicine

## 2017-10-16 ENCOUNTER — Emergency Department (HOSPITAL_COMMUNITY): Payer: Self-pay

## 2017-10-16 ENCOUNTER — Encounter (HOSPITAL_COMMUNITY): Payer: Self-pay | Admitting: Emergency Medicine

## 2017-10-16 ENCOUNTER — Other Ambulatory Visit: Payer: Self-pay

## 2017-10-16 DIAGNOSIS — M79632 Pain in left forearm: Secondary | ICD-10-CM | POA: Insufficient documentation

## 2017-10-16 DIAGNOSIS — F17200 Nicotine dependence, unspecified, uncomplicated: Secondary | ICD-10-CM | POA: Insufficient documentation

## 2017-10-16 DIAGNOSIS — R0789 Other chest pain: Secondary | ICD-10-CM | POA: Insufficient documentation

## 2017-10-16 LAB — I-STAT BETA HCG BLOOD, ED (MC, WL, AP ONLY)

## 2017-10-16 LAB — BASIC METABOLIC PANEL
Anion gap: 12 (ref 5–15)
BUN: 13 mg/dL (ref 6–20)
CHLORIDE: 106 mmol/L (ref 98–111)
CO2: 21 mmol/L — ABNORMAL LOW (ref 22–32)
CREATININE: 0.7 mg/dL (ref 0.44–1.00)
Calcium: 8.6 mg/dL — ABNORMAL LOW (ref 8.9–10.3)
GFR calc Af Amer: >60 mL/min (ref >60)
GFR calc non Af Amer: >60 mL/min (ref >60)
Glucose, Bld: 95 mg/dL (ref 70–99)
POTASSIUM: 4 mmol/L (ref 3.5–5.1)
SODIUM: 139 mmol/L (ref 135–145)

## 2017-10-16 LAB — I-STAT TROPONIN, ED: Troponin i, poc: 0 ng/mL (ref 0.00–0.08)

## 2017-10-16 LAB — CBC
HEMATOCRIT: 47.6 % — AB (ref 36.0–46.0)
Hemoglobin: 16.7 g/dL — ABNORMAL HIGH (ref 12.0–15.0)
MCH: 35.9 pg — AB (ref 26.0–34.0)
MCHC: 35.1 g/dL (ref 30.0–36.0)
MCV: 102.4 fL — AB (ref 78.0–100.0)
PLATELETS: 259 10*3/uL (ref 150–400)
RBC: 4.65 MIL/uL (ref 3.87–5.11)
RDW: 12.8 % (ref 11.5–15.5)
WBC: 11.6 10*3/uL — ABNORMAL HIGH (ref 4.0–10.5)

## 2017-10-16 LAB — D-DIMER, QUANTITATIVE (NOT AT ARMC)

## 2017-10-16 MED ORDER — MELOXICAM 7.5 MG PO TABS: 7.5000 mg | ORAL_TABLET | Freq: Every day | ORAL | 0 refills | Status: DC

## 2017-10-16 NOTE — Discharge Instructions (Addendum)
Take meloxicam as prescribed for the next 3 days.  Then take as needed for pain. Return to the ED immediately for new or worsening symptoms, such as new or worsening chest pain, shortness of breath, fevers or any concerns at all.

## 2017-10-16 NOTE — ED Provider Notes (Signed)
[facility] DEPT Provider Note   CSN: [id number] Arrival date & time: 10/16/17  5643     History   Chief Complaint Chief Complaint  Patient presents with  . Chest Pain    HPI Megan Villanueva is a 32 y.o. female.  32 year old female daily smoker, presents with a 2-week history of intermittent chest pain.  Patient describes pain as midsternal radiating to her back.  She states pain feels like the bone is hurting.  She denies any associated shortness of breath, nausea, vomiting, diaphoresis.  Patient denies any aggravating or alleviating factors of her chest pain.  Patient denies any cough, sputum production, fevers, chills.  Patient states she was seen and evaluated for this 2 weeks ago but symptoms have persisted.  She notes she became concerned because yesterday she had a "heavy feeling" in her left forearm.  She states this feeling radiates from her wrist to her elbow.  She denies any numbness or tingling, decreased range of motion, decreased grip strength.  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  Patient denies any injury or trauma to the arm.  Patient denies any recent travel/trauma/immobilization.  Patient has a Mirena IUD.  Patient denies past medical history of PE/DVT.  She denies any peripheral edema.  The history is provided by the patient.  Chest Pain   The current episode started more than 1 week ago. The problem occurs constantly. The problem has not changed since onset.The pain is present in the substernal region. Associated symptoms include back pain (pain radiates from the chest to the back). Pertinent negatives include no abdominal pain, no cough, no diaphoresis, no fever, no leg pain, no lower extremity edema, no nausea, no numbness, no shortness of breath and no vomiting.    Past Medical History:  Diagnosis Date  . History of chicken pox     There are no active problems to display for this  patient.   Past Surgical History:  Procedure Laterality Date  . MOUTH SURGERY       OB History    Gravida  2   Para  2   Term  2   Preterm      AB      Living  2     SAB      TAB      Ectopic      Multiple      Live Births  2            Home Medications    Prior to Admission medications   Medication Sig Start Date End Date Taking? Authorizing Provider  acetaminophen (TYLENOL) 500 MG tablet Take 500 mg by mouth daily as needed (pain).    [provider]  fluticasone (CUTIVATE) 0.05 % cream Apply topically 2 (two) times daily. Sparingly. Patient not taking: Reported on 09/30/2017 06/06/15   Billy Fischer, MD  ibuprofen (ADVIL,MOTRIN) 200 MG tablet Take 200 mg by mouth daily as needed (chest pain).    [provider]  mupirocin ointment (BACTROBAN) 2 % Apply 1 application topically 2 (two) times daily. Patient not taking: Reported on 09/30/2017 06/13/15   Melony Overly, MD  neomycin-polymyxin-hydrocortisone (CORTISPORIN) 3.5-10000-1 otic suspension Place 4 drops into the right ear 4 (four) times daily. for 1 week Patient not taking: Reported on 09/30/2017 06/13/15   Melony Overly, MD  ranitidine (ZANTAC) 75 MG tablet Take 150 mg by mouth daily as needed for heartburn.  [provider]    Family History Family History  Problem Relation Age of Onset  . Diabetes Maternal Grandmother     Social History Social History   Tobacco Use  . Smoking status: Current Every Day Smoker    Packs/day: 1.00  . Smokeless tobacco: Never Used  Substance Use Topics  . Alcohol use: Yes    Comment: social  . Drug use: No     Allergies   Patient has no known allergies.   Review of Systems Review of Systems  Constitutional: Negative for chills, diaphoresis and fever.  HENT: Negative for rhinorrhea and sore throat.   Eyes: Negative for visual disturbance.  Respiratory: Negative for cough and shortness of breath.   Cardiovascular: Positive  for chest pain. Negative for leg swelling.  Gastrointestinal: Negative for abdominal pain, diarrhea, nausea and vomiting.  Genitourinary: Negative for dysuria, frequency and urgency.  Musculoskeletal: Positive for back pain (pain radiates from the chest to the back). Negative for joint swelling and neck pain.  Skin: Negative for rash and wound.  Neurological: Negative for syncope and numbness.       "heavy feeling" in left forearm  All other systems reviewed and are negative.    Physical Exam Updated Vital Signs BP 140/86 (BP Location: Right Arm)   Pulse 87   Temp 98.7 F (37.1 C) (Oral)   Resp 20   SpO2 96%   Physical Exam  Constitutional: She is oriented to person, place, and time. She appears well-developed and well-nourished.  HENT:  Head: Normocephalic and atraumatic.  Eyes: Conjunctivae and EOM are normal.  Neck: Neck supple.  Cardiovascular: Normal rate, regular rhythm and normal heart sounds.  No murmur heard. Pulmonary/Chest: Effort normal and breath sounds normal. No respiratory distress. She has no wheezes. She has no rales.  Abdominal: Soft. Bowel sounds are normal. She exhibits no distension. There is no tenderness.  Musculoskeletal: Normal range of motion. She exhibits no tenderness or deformity.       Right lower leg: She exhibits no edema.  Patient has full range of motion of bilateral upper extremities, strength 5 out of 5, neurovascular intact distally.  When palpating the ulnar nerve at the level of the elbow, patient's symptoms worsen  Neurological: She is alert and oriented to person, place, and time.  Skin: Skin is warm and dry. No rash noted. No erythema.  Psychiatric: She has a normal mood and affect. Her behavior is normal.  Nursing note and vitals reviewed.    ED Treatments / Results  Labs (all labs ordered are listed, but only abnormal results are displayed) Labs Reviewed  BASIC METABOLIC PANEL - Abnormal; Notable for the following components:       Result Value   CO2 21 (*)    Calcium 8.6 (*)    All other components within normal limits  CBC - Abnormal; Notable for the following components:   WBC 11.6 (*)    Hemoglobin 16.7 (*)    HCT 47.6 (*)    MCV 102.4 (*)    MCH 35.9 (*)    All other components within normal limits  D-DIMER, QUANTITATIVE (NOT AT [facility]  I-STAT TROPONIN, ED  I-STAT BETA HCG BLOOD, ED ([facility], [facility], [facility] ONLY)    EKG EKG Interpretation  Date/Time:  Friday October 16 2017 08:27:44 EDT Ventricular Rate:  87 PR Interval:    QRS Duration: 95 QT Interval:  370 QTC Calculation: 446 R Axis:   68 Text Interpretation:  Sinus rhythm Low  voltage, precordial leads No acute changes No significant change since last tracing Confirmed by Varney Biles [phone]) on 10/16/2017 9:07:47 AM   Radiology Dg Chest 2 View  Result Date: 10/16/2017 CLINICAL DATA:  Chest pain EXAM: CHEST - 2 VIEW COMPARISON:  09/30/2017 FINDINGS: Normal heart size and mediastinal contours. No acute infiltrate or edema. No effusion or pneumothorax. No acute osseous findings. Artifact from EKG leads. IMPRESSION: Negative chest. Electronically Signed   By: Monte Fantasia M.D.   On: 10/16/2017 09:43    Procedures Procedures (including critical care time)  Medications Ordered in ED Medications - No data to display   Initial Impression / Assessment and Plan / ED Course  I have reviewed the triage vital signs and the nursing notes.  Pertinent labs & imaging results that were available during my care of the patient were reviewed by me and considered in my medical decision making (see chart for details).     DDI negative, trop negative, no acute changes in EKG. No acute findings on X-ray.  History and physical most consistent with musculoskeletal pain.  Will place patient on anti-inflammatory for several days.  She has been instructed to take as prescribed.  She is agreeable with this plan.  Encourage follow-up with her primary care provider.   At  this time there does not appear to be any evidence of an acute emergency medical condition and the patient appears stable for discharge with appropriate outpatient follow up. Diagnosis was discussed with patient who verbalizes understanding and is agreeable to discharge. Pt case discussed with Dr. Kathrynn Humble who agrees with my plan.   Final Clinical Impressions(s) / ED Diagnoses   Final diagnoses:  None    ED Discharge Orders    None       Etter Sjogren, PA-C 10/16/17 [place], Ankit, MD 10/17/17 [phone]

## 2017-10-16 NOTE — ED Triage Notes (Signed)
Pt complaint of central chest pressure ongoing for 2 weeks; radiating to back and left arm.

## 2017-12-08 ENCOUNTER — Ambulatory Visit (INDEPENDENT_AMBULATORY_CARE_PROVIDER_SITE_OTHER): Payer: Self-pay | Admitting: Family Medicine

## 2017-12-08 ENCOUNTER — Encounter: Payer: Self-pay | Admitting: Family Medicine

## 2017-12-08 VITALS — BP 112/73 | HR 87 | Temp 97.8°F | Resp 17 | Ht 62.0 in | Wt 198.2 lb

## 2017-12-08 DIAGNOSIS — R0789 Other chest pain: Secondary | ICD-10-CM

## 2017-12-08 MED ORDER — PREDNISONE 20 MG PO TABS: ORAL_TABLET | ORAL | 0 refills | Status: DC

## 2017-12-08 NOTE — Patient Instructions (Addendum)
I am starting you on prednisone. Take as follows: Take Prednisone 20 mg,  in mornings with breakfast as follows:  Take 3 pills for 2 days, Take 2 pills for 3 days, and Take 1 pill for 1 days.  Complete all medication.   Avoid taking any other NSAIDS with this medication. For acute pain, you can take Tylenol 500 mg every 6 hours as needed.   I'll see you back in 1 week.     Chest Wall Pain Chest wall pain is pain in or around the bones and muscles of your chest. Sometimes, an injury causes this pain. Sometimes, the cause may not be known. This pain may take several weeks or longer to get better. Follow these instructions at home: Pay attention to any changes in your symptoms. Take these actions to help with your pain:  Rest as told by your doctor.  Avoid activities that cause pain. Try not to use your chest, belly (abdominal), or side muscles to lift heavy things.  If directed, apply ice to the painful area: ? Put ice in a plastic bag. ? Place a towel between your skin and the bag. ? Leave the ice on for 20 minutes, 2-3 times per day.  Take over-the-counter and prescription medicines only as told by your doctor.  Do not use tobacco products, including cigarettes, chewing tobacco, and e-cigarettes. If you need help quitting, ask your doctor.  Keep all follow-up visits as told by your doctor. This is important.  Contact a doctor if:  You have a fever.  Your chest pain gets worse.  You have new symptoms. Get help right away if:  You feel sick to your stomach (nauseous) or you throw up (vomit).  You feel sweaty or light-headed.  You have a cough with phlegm (sputum) or you cough up blood.  You are short of breath. This information is not intended to replace advice given to you by your health care provider. Make sure you discuss any questions you have with your health care provider. Document Released: 06/18/2007 Document Revised: 06/07/2015 Document Reviewed:  03/27/2014 Elsevier Interactive Patient Education  [employer].

## 2017-12-08 NOTE — Progress Notes (Signed)
Megan Villanueva, is a 32 y.o. female  KCL:[id number]  CBS:[id number]  DOB - 11-17-1985  CC:  Chief Complaint  Patient presents with  . Establish Care  . Follow-up    ED 10/4: chest pain       HPI: Megan Villanueva is a 32 y.o. female is here today to establish care.   Megan Villanueva does not have a problem list on file.    Today's visit:  Megan Villanueva is here today to establish care. Presented to the ED on 10/16/17 with a complaint of chest pain.  Was evaluated thoroughly during her visit at [facility], [place]. She had a negative d-dimer, negative troponins, she was also found to be negative for pericarditis with a normal EKG.  Plan sedated pain continues.  She reports the pain is intermittent and on average the pain levels about a 1 or 2.  She denies any cough or shortness of breath associated with chest tightness or heaviness.  Also complains that the pain is also noted to radiate from her chest to her back.  She describes the pain as an achiness.  She has no associated nausea or vomiting with current symptoms.  Any GI complaints.  Was prescribed anti-inflammatories with mild improvement of symptoms, although pain returned once anti-inflammatory were stopped. No family history of heart disease denies any known congenital heart disease. No prior history of chest trauma or back injury.   Current medications: Current Outpatient Medications:  .  ibuprofen (ADVIL,MOTRIN) 200 MG tablet, Take 200 mg by mouth daily as needed (chest pain)., Disp: , Rfl:    Pertinent family medical history: family history includes Diabetes in her maternal grandmother; Hypertension in her mother.   No Known Allergies  Social History   Socioeconomic History  . Marital status: Single    Spouse name: Not on file  . Number of children: Not on file  . Years of education: Not on file  . Highest education level: Not on file  Occupational History  . Not on file  Social Needs  . Financial resource strain: Not on file   . Food insecurity:    Worry: Not on file    Inability: Not on file  . Transportation needs:    Medical: Not on file    Non-medical: Not on file  Tobacco Use  . Smoking status: Current Every Day Smoker    Packs/day: 1.00  . Smokeless tobacco: Never Used  Substance and Sexual Activity  . Alcohol use: Yes    Comment: social  . Drug use: No  . Sexual activity: Yes    Birth control/protection: IUD  Lifestyle  . Physical activity:    Days per week: Not on file    Minutes per session: Not on file  . Stress: Not on file  Relationships  . Social connections:    Talks on phone: Not on file    Gets together: Not on file    Attends religious service: Not on file    Active member of club or organization: Not on file    Attends meetings of clubs or organizations: Not on file    Relationship status: Not on file  . Intimate partner violence:    Fear of current or ex partner: Not on file    Emotionally abused: Not on file    Physically abused: Not on file    Forced sexual activity: Not on file  Other Topics Concern  . Not on file  Social History Narrative  . Not on file  Review of Systems: Constitutional: Negative for fever, chills, diaphoresis, activity change, appetite change and fatigue. Respiratory: Negative for cough, choking, chest tightness, shortness of breath, wheezing and stridor.  Cardiovascular: See HPI Gastrointestinal: Negative for abdominal distention. Musculoskeletal: See HPI Neurological: Negative for dizziness, tremors, seizures, syncope, facial asymmetry, speech difficulty, weakness, light-headedness, numbness and headaches.  Hematological: Negative for adenopathy. Does not bruise/bleed easily. Psychiatric/Behavioral: Negative for hallucinations, behavioral problems, confusion, dysphoric mood, decreased concentration and agitation.    Objective:   Vitals:   12/08/17 1057  BP: 112/73  Pulse: 87  Resp: 17  Temp: 97.8 F (36.6 C)  SpO2: 95%    BP  Readings from Last 3 Encounters:  12/08/17 112/73  10/16/17 131/90  09/30/17 (!) 135/92    Filed Weights   12/08/17 1057  Weight: 198 lb 3.2 oz (89.9 kg)      Physical Exam: Constitutional: Patient appears well-developed and well-nourished. No distress. HENT: Normocephalic, atraumatic, External right and left ear normal. Oropharynx is clear and moist.  Eyes: Conjunctivae and EOM are normal. PERRLA, no scleral icterus. Neck: Normal ROM. Neck supple. No JVD. No tracheal deviation. No thyromegaly. CVS: RRR, S1/S2 +, no murmurs, no gallops, no carotid bruit.  Pulmonary: Effort and breath sounds normal, no stridor, rhonchi, wheezes, rales.  Abdominal: Soft. BS +, no distension, tenderness, rebound or guarding.  Musculoskeletal: Reproducible tenderness with palpation of sternum and right mid rib cage Neuro: Alert. Normal muscle tone coordination. Normal gait. BUE and BLE strength 5/5. Bilateral hand grips symmetrical. No cranial nerve deficit. Skin: Skin is warm and dry. No rash noted. Not diaphoretic. No erythema. No pallor. Psychiatric: Normal mood and affect. Behavior, judgment, thought content normal.  Lab Results (prior encounters)  Lab Results  Component Value Date   WBC 11.6 (H) 10/16/2017   HGB 16.7 (H) 10/16/2017   HCT 47.6 (H) 10/16/2017   MCV 102.4 (H) 10/16/2017   PLT 259 10/16/2017   Lab Results  Component Value Date   CREATININE 0.70 10/16/2017   BUN 13 10/16/2017   NA 139 10/16/2017   K 4.0 10/16/2017   CL 106 10/16/2017   CO2 21 (L) 10/16/2017       Assessment and plan:  1. Chest wall pain Suspect chest wall pain is due to inflammation.  Patient received moderate relief when taking ibuprofen consistently and pain returned upon cessation of ibuprofen.  Will trial a course of prednisone. She will follow-up in 8 days for further evaluation.  Meds ordered this encounter  Medications  . predniSONE (DELTASONE) 20 MG tablet    Sig: Take 3 PO QAM x 2days, 2 PO QAM  x3days, 1 PO QAM x2days    Dispense:  14 tablet    Refill:  0    Return in about 8 days (around 12/16/2017) for chest wall pain .   The patient was given clear instructions to go to ER or return to medical center if symptoms don't improve, worsen or new problems develop. The patient verbalized understanding. The patient was advised  to call and obtain lab results if they haven't heard anything from out office within 7-10 business days.  Molli Barrows, FNP Primary Care at [facility] [address], [place] [address] [phone]: [phone]    This note has been created with Dragon speech recognition software and Engineer, materials. Any transcriptional errors are unintentional.

## 2017-12-08 NOTE — Progress Notes (Signed)
Megan Villanueva, is a 32 y.o. female  NIO:[id number]  GHW:[id number]  DOB - 10/16/1985  CC:  Chief Complaint  Patient presents with  . Establish Care  . Follow-up    ED 10/4: chest pain       HPI: Megan Villanueva is a 32 y.o. female is here today to establish care.   Tyresha Fede does not have a problem list on file.    Today's visit:    Patient denies new headaches, chest pain, abdominal pain, nausea, new weakness , numbness or tingling, SOB, edema, or worrisome cough. .    Current medications: Current Outpatient Medications:  .  ibuprofen (ADVIL,MOTRIN) 200 MG tablet, Take 200 mg by mouth daily as needed (chest pain)., Disp: , Rfl:    Pertinent family medical history: family history includes Diabetes in her maternal grandmother; Hypertension in her mother.   No Known Allergies  Social History   Socioeconomic History  . Marital status: Single    Spouse name: Not on file  . Number of children: Not on file  . Years of education: Not on file  . Highest education level: Not on file  Occupational History  . Not on file  Social Needs  . Financial resource strain: Not on file  . Food insecurity:    Worry: Not on file    Inability: Not on file  . Transportation needs:    Medical: Not on file    Non-medical: Not on file  Tobacco Use  . Smoking status: Current Every Day Smoker    Packs/day: 1.00  . Smokeless tobacco: Never Used  Substance and Sexual Activity  . Alcohol use: Yes    Comment: social  . Drug use: No  . Sexual activity: Yes    Birth control/protection: IUD  Lifestyle  . Physical activity:    Days per week: Not on file    Minutes per session: Not on file  . Stress: Not on file  Relationships  . Social connections:    Talks on phone: Not on file    Gets together: Not on file    Attends religious service: Not on file    Active member of club or organization: Not on file    Attends meetings of clubs or organizations: Not on file    Relationship  status: Not on file  . Intimate partner violence:    Fear of current or ex partner: Not on file    Emotionally abused: Not on file    Physically abused: Not on file    Forced sexual activity: Not on file  Other Topics Concern  . Not on file  Social History Narrative  . Not on file    Review of Systems: Constitutional: Negative for fever, chills, diaphoresis, activity change, appetite change and fatigue. HENT: Negative for ear pain, nosebleeds, congestion, facial swelling, rhinorrhea, neck pain, neck stiffness and ear discharge.  Eyes: Negative for pain, discharge, redness, itching and visual disturbance. Respiratory: Negative for cough, choking, chest tightness, shortness of breath, wheezing and stridor.  Cardiovascular: Negative for chest pain, palpitations and leg swelling. Gastrointestinal: Negative for abdominal distention. Genitourinary: Negative for dysuria, urgency, frequency, hematuria, flank pain, decreased urine volume, difficulty urinating. Musculoskeletal: Negative for back pain, joint swelling, arthralgia and gait problem. Neurological: Negative for dizziness, tremors, seizures, syncope, facial asymmetry, speech difficulty, weakness, light-headedness, numbness and headaches.  Hematological: Negative for adenopathy. Does not bruise/bleed easily. Psychiatric/Behavioral: Negative for hallucinations, behavioral problems, confusion, dysphoric mood, decreased concentration and agitation.    Objective:  Vitals:   12/08/17 1057  BP: 112/73  Pulse: 87  Resp: 17  Temp: 97.8 F (36.6 C)  SpO2: 95%    BP Readings from Last 3 Encounters:  12/08/17 112/73  10/16/17 131/90  09/30/17 (!) 135/92    Filed Weights   12/08/17 1057  Weight: 198 lb 3.2 oz (89.9 kg)      Physical Exam: Constitutional: Patient appears well-developed and well-nourished. No distress. HENT: Normocephalic, atraumatic, External right and left ear normal. Oropharynx is clear and moist.  Eyes:  Conjunctivae and EOM are normal. PERRLA, no scleral icterus. Neck: Normal ROM. Neck supple. No JVD. No tracheal deviation. No thyromegaly. CVS: RRR, S1/S2 +, no murmurs, no gallops, no carotid bruit.  Pulmonary: Effort and breath sounds normal, no stridor, rhonchi, wheezes, rales.  Abdominal: Soft. BS +, no distension, tenderness, rebound or guarding.  Musculoskeletal: Normal range of motion. No edema and no tenderness.  Neuro: Alert. Normal muscle tone coordination. Normal gait. BUE and BLE strength 5/5. Bilateral hand grips symmetrical. No cranial nerve deficit. Skin: Skin is warm and dry. No rash noted. Not diaphoretic. No erythema. No pallor. Psychiatric: Normal mood and affect. Behavior, judgment, thought content normal.  Lab Results (prior encounters)  Lab Results  Component Value Date   WBC 11.6 (H) 10/16/2017   HGB 16.7 (H) 10/16/2017   HCT 47.6 (H) 10/16/2017   MCV 102.4 (H) 10/16/2017   PLT 259 10/16/2017   Lab Results  Component Value Date   CREATININE 0.70 10/16/2017   BUN 13 10/16/2017   NA 139 10/16/2017   K 4.0 10/16/2017   CL 106 10/16/2017   CO2 21 (L) 10/16/2017    No results found for: HGBA1C  No results found for: CHOL, TRIG, HDL, CHOLHDL, VLDL, LDLCALC      Assessment and plan:  There are no diagnoses linked to this encounter.  No follow-ups on file.   The patient was given clear instructions to go to ER or return to medical center if symptoms don't improve, worsen or new problems develop. The patient verbalized understanding. The patient was advised  to call and obtain lab results if they haven't heard anything from out office within 7-10 business days.  Molli Barrows, FNP Primary Care at [facility] [address], [place] [address] [phone]: [phone]    This note has been created with Dragon speech recognition software and Engineer, materials. Any transcriptional errors are unintentional.

## 2017-12-13 DIAGNOSIS — R0789 Other chest pain: Secondary | ICD-10-CM | POA: Insufficient documentation

## 2017-12-16 ENCOUNTER — Ambulatory Visit (INDEPENDENT_AMBULATORY_CARE_PROVIDER_SITE_OTHER): Payer: Self-pay | Admitting: Family Medicine

## 2017-12-16 ENCOUNTER — Encounter: Payer: Self-pay | Admitting: Family Medicine

## 2017-12-16 VITALS — BP 134/88 | HR 96 | Resp 17 | Ht 62.0 in | Wt 195.6 lb

## 2017-12-16 DIAGNOSIS — R0789 Other chest pain: Secondary | ICD-10-CM

## 2017-12-16 DIAGNOSIS — R0781 Pleurodynia: Secondary | ICD-10-CM

## 2017-12-18 ENCOUNTER — Telehealth: Payer: Self-pay | Admitting: Family Medicine

## 2017-12-18 DIAGNOSIS — R0789 Other chest pain: Secondary | ICD-10-CM

## 2017-12-18 NOTE — Telephone Encounter (Signed)
Please advise 

## 2017-12-18 NOTE — Telephone Encounter (Signed)
Patient called stating that she would like to go ahead and get a CT done, states she has felt sharp pain in her chest and in her arm. Please follow up.

## 2017-12-21 NOTE — Progress Notes (Signed)
Established Patient Office Visit  Subjective:  Patient ID: Megan Villanueva, female    DOB: [birth year]  Age: 32 y.o. MRN: [id number]  CC:  Chief Complaint  Patient presents with  . Follow-up    f/up on chest wall pain. states that the pain got much better. fully resolved the first few days of prednisone but then came back.  HPI Megan Villanueva presents for follow-up of chest wall and left rib pain. Last office visit 12/08/2017, patient was evaluated for on-going chest wall pain which has been ongoing since early September. Previously worked up for PE which was negative. Cardiovascular etiology was ruled out . At last visit, she was treated with prednisone after achieving mild relief with antiinflammatories. Today, she reports significant improvement in symptoms. After a few days of prednisone, symptoms completely resolved. After medication was completed, he has had intermittent episodes of chest wall aching along with left-sided lower rib pain.  She reports that the pain at most is a 2 out of 10 and sometimes just a dull ache.  She denies any sharp chest pain, any shortness of breath, or cough. No history of chest injury.  She is taking   Past Medical History:  Diagnosis Date  . History of chicken pox     Past Surgical History:  Procedure Laterality Date  . MOUTH SURGERY      Family History  Problem Relation Age of Onset  . Diabetes Maternal Grandmother   . Hypertension Mother   . Heart disease Neg Hx     Social History   Socioeconomic History  . Marital status: Single    Spouse name: Not on file  . Number of children: Not on file  . Years of education: Not on file  . Highest education level: Not on file  Occupational History  . Not on file  Social Needs  . Financial resource strain: Not on file  . Food insecurity:    Worry: Not on file    Inability: Not on file  . Transportation needs:    Medical: Not on file    Non-medical: Not on file  Tobacco Use  . Smoking  status: Current Every Day Smoker    Packs/day: 1.00  . Smokeless tobacco: Never Used  Substance and Sexual Activity  . Alcohol use: Yes    Comment: social  . Drug use: No  . Sexual activity: Yes    Birth control/protection: IUD  Lifestyle  . Physical activity:    Days per week: Not on file    Minutes per session: Not on file  . Stress: Not on file  Relationships  . Social connections:    Talks on phone: Not on file    Gets together: Not on file    Attends religious service: Not on file    Active member of club or organization: Not on file    Attends meetings of clubs or organizations: Not on file    Relationship status: Not on file  . Intimate partner violence:    Fear of current or ex partner: Not on file    Emotionally abused: Not on file    Physically abused: Not on file    Forced sexual activity: Not on file  Other Topics Concern  . Not on file  Social History Narrative  . Not on file    Outpatient Medications Prior to Visit  Medication Sig Dispense Refill  . ibuprofen (ADVIL,MOTRIN) 200 MG tablet Take 200 mg by mouth daily as needed (chest pain).    Marland Kitchen  predniSONE (DELTASONE) 20 MG tablet Take 3 PO QAM x 2days, 2 PO QAM x3days, 1 PO QAM x2days 14 tablet 0   No facility-administered medications prior to visit.     No Known Allergies  ROS Review of Systems   Negatives listed in HPI Objective:    Physical Exam  BP 134/88   Pulse 96   Resp 17   Ht 5\' 2"  (1.575 m)   Wt 195 lb 9.6 oz (88.7 kg)   SpO2 96%   BMI 35.78 kg/m  Wt Readings from Last 3 Encounters:  12/16/17 195 lb 9.6 oz (88.7 kg)  12/08/17 198 lb 3.2 oz (89.9 kg)  09/30/17 165 lb (74.8 kg)    General appearance: alert, well developed, well nourished, cooperative and in no distress Head: Normocephalic, without obvious abnormality, atraumatic Respiratory: Respirations even and unlabored, normal respiratory rate Heart: Rate and rhythm normal.  No gallops, murmurs, extra heart sounds noted.   Radial pulse +2 bilaterally.  No edema present. Extremities: No gross deformities Skin: Skin color, texture, turgor normal. No rashes seen  Psych: Appropriate mood and affect. Neurologic: Mental status: Alert, oriented to person, place, and time, thought content appropriate.   Health Maintenance Due  Topic Date Due  . PAP SMEAR  12/31/2007  . TETANUS/TDAP  01/14/2016  . INFLUENZA VACCINE  08/13/2017    There are no preventive care reminders to display for this patient.  No results found for: TSH Lab Results  Component Value Date   WBC 11.6 (H) 10/16/2017   HGB 16.7 (H) 10/16/2017   HCT 47.6 (H) 10/16/2017   MCV 102.4 (H) 10/16/2017   PLT 259 10/16/2017   Lab Results  Component Value Date   NA 139 10/16/2017   K 4.0 10/16/2017   CO2 21 (L) 10/16/2017   GLUCOSE 95 10/16/2017   BUN 13 10/16/2017   CREATININE 0.70 10/16/2017   CALCIUM 8.6 (L) 10/16/2017   ANIONGAP 12 10/16/2017   No results found for: CHOL No results found for: HDL No results found for: LDLCALC No results found for: TRIG No results found for: CHOLHDL No results found for: HGBA1C    Assessment & Plan:   Problem List Items Addressed This Visit      Other   Chest wall pain - Primary    Other Visit Diagnoses    Rib pain on left side        Advised to trial a course of Pepcid AC OTC to rule out acid reflux as an etiology of the chest wall pain as it is coming and going.  Also recommended to continue ibuprofen for at least another 2 weeks.  If no improvement will consider a chest CT.  No concern for PE as patient is in no distress and is not having any concerning symptoms of shortness of breath or chest tightness.  Highly suspicious that this is related to a chest wall strain injury that patient is unable to recall.   Follow-up: Schedule follow-up for complete physical exam at your convenience.   Molli Barrows, FNP [facility] at [facility] [address], [place]  [place] [phone]: [phone]

## 2017-12-21 NOTE — Telephone Encounter (Signed)
Please advise patient, I will order a CT however, this will likely not be covered by her insurance and she will have to pay out of pocket for the study. Order has been placed. Please schedule 1-2 weeks out and advise patient to cancel appointment if symptoms resolve.

## 2017-12-24 ENCOUNTER — Encounter (HOSPITAL_COMMUNITY): Payer: Self-pay

## 2017-12-24 ENCOUNTER — Ambulatory Visit (HOSPITAL_COMMUNITY)
Admission: RE | Admit: 2017-12-24 | Discharge: 2017-12-24 | Disposition: A | Discharge status: Home / Self Care | Payer: Medicaid Other | Source: Ambulatory Visit | Attending: Family Medicine | Admitting: Family Medicine

## 2017-12-24 ENCOUNTER — Telehealth: Payer: Self-pay | Admitting: Family Medicine

## 2017-12-24 ENCOUNTER — Emergency Department (HOSPITAL_COMMUNITY)
Admission: EM | Admit: 2017-12-24 | Discharge: 2017-12-24 | Discharge status: Absent without leave | Payer: Medicaid Other

## 2017-12-24 DIAGNOSIS — R0789 Other chest pain: Secondary | ICD-10-CM | POA: Insufficient documentation

## 2017-12-24 NOTE — Telephone Encounter (Signed)
Contact patient to advise CT of chest completely normal and doesn't provide any insight to the cause of her symptoms. I recommend continued antiinflammatory as needed for pain.

## 2017-12-25 NOTE — Telephone Encounter (Signed)
Patient notified of CT results & recommendations. Expressed understanding.

## 2018-03-10 ENCOUNTER — Encounter: Payer: Self-pay | Admitting: Emergency Medicine

## 2018-03-10 ENCOUNTER — Ambulatory Visit
Admission: EM | Admit: 2018-03-10 | Discharge: 2018-03-10 | Disposition: A | Discharge status: Home / Self Care | Payer: Medicaid Other | Attending: Emergency Medicine | Admitting: Emergency Medicine

## 2018-03-10 DIAGNOSIS — J029 Acute pharyngitis, unspecified: Secondary | ICD-10-CM

## 2018-03-10 LAB — POCT RAPID STREP A (OFFICE): RAPID STREP A SCREEN: NEGATIVE

## 2018-03-10 LAB — POCT MONO SCREEN (KUC): Mono, POC: NEGATIVE

## 2018-03-10 MED ORDER — OMEPRAZOLE 20 MG PO CPDR: 20.0000 mg | DELAYED_RELEASE_CAPSULE | Freq: Every day | ORAL | 0 refills | Status: AC

## 2018-03-10 MED ORDER — FLUTICASONE PROPIONATE 50 MCG/ACT NA SUSP: 1.0000 | Freq: Every day | NASAL | 2 refills | Status: DC

## 2018-03-10 MED ORDER — PREDNISONE 20 MG PO TABS: 40.0000 mg | ORAL_TABLET | Freq: Every day | ORAL | 0 refills | Status: AC

## 2018-03-10 NOTE — Discharge Instructions (Signed)
Negative for mono and strep which is reassuring.  Your vitals are stable, without fevers, which is also reassuring.  We will try a few treatment options to see if your symptoms improve.  5 days of prednisone which can help with swelling. Daily nasal spray which also helps with swelling.  Daily omeprazole as well, as sometimes reflux symptoms can lead to sore throat.  Please follow up with your primary care provider and/or ENT if symptoms do not improve in the next week.

## 2018-03-10 NOTE — ED Triage Notes (Signed)
Pt c/o sore throat x4wks

## 2018-03-10 NOTE — ED Provider Notes (Signed)
[facility]    CSN: [id number] Arrival date & time: 03/10/18  1046     History   Chief Complaint Chief Complaint  Patient presents with  . Sore Throat    sore throat for 4 weeks, no fever     HPI Megan Villanueva is a 33 y.o. female.   Megan Villanueva presents with complaints of sore throat for the past 4 weeks. Waxes and wanes in severity. Seems to improve in the morning and worse as the day progresses. No fevers. She feels like it is swollen. No rash. No gi/gu complaints. Over the past few days has developed congestion, cough runny nose, but this is new. No ear pain. No specific difficulty with swallowing. Has been trying ibuprofen which hasn't helped with symptoms. Hasn't taken today. No known ill contacts. She does smoke, approximately 1 ppd. Denies any previous similar. Still has her tonsils. Without contributing medical history.     ROS per HPI.      Past Medical History:  Diagnosis Date  . History of chicken pox     Patient Active Problem List   Diagnosis Date Noted  . Chest wall pain 12/13/2017    Past Surgical History:  Procedure Laterality Date  . MOUTH SURGERY      OB History    Gravida  2   Para  2   Term  2   Preterm      AB      Living  2     SAB      TAB      Ectopic      Multiple      Live Births  2            Home Medications    Prior to Admission medications   Medication Sig Start Date End Date Taking? Authorizing Provider  fluticasone (FLONASE) 50 MCG/ACT nasal spray Place 1 spray into both nostrils daily. 03/10/18   Zigmund Gottron, NP  ibuprofen (ADVIL,MOTRIN) 200 MG tablet Take 200 mg by mouth daily as needed (chest pain).    [provider]  omeprazole (PRILOSEC) 20 MG capsule Take 1 capsule (20 mg total) by mouth daily. 03/10/18   Zigmund Gottron, NP  predniSONE (DELTASONE) 20 MG tablet Take 2 tablets (40 mg total) by mouth daily with breakfast for 5 days. 03/10/18 03/15/18  Zigmund Gottron, NP     Family History Family History  Problem Relation Age of Onset  . Diabetes Maternal Grandmother   . Hypertension Mother   . Heart disease Neg Hx     Social History Social History   Tobacco Use  . Smoking status: Current Every Day Smoker    Packs/day: 1.00  . Smokeless tobacco: Never Used  Substance Use Topics  . Alcohol use: Yes    Comment: social  . Drug use: No     Allergies   Patient has no known allergies.   Review of Systems Review of Systems   Physical Exam Triage Vital Signs ED Triage Vitals  Enc Vitals Group     BP      Pulse      Resp      Temp      Temp src      SpO2      Weight      Height      Head Circumference      Peak Flow      Pain Score      Pain Loc  Pain Edu?      Excl. in [place]?    No data found.  Updated Vital Signs BP 125/77 (BP Location: Left Arm)   Pulse 88   Temp 98.2 F (36.8 C) (Oral)   Resp 18   SpO2 97%    Physical Exam Constitutional:      General: She is not in acute distress.    Appearance: She is well-developed.  HENT:     Head: Normocephalic and atraumatic.     Right Ear: Tympanic membrane, ear canal and external ear normal.     Left Ear: Tympanic membrane, ear canal and external ear normal.     Nose: Rhinorrhea present.     Mouth/Throat:     Mouth: Mucous membranes are moist. No oral lesions.     Pharynx: Uvula midline. No pharyngeal swelling or uvula swelling.     Tonsils: No tonsillar exudate or tonsillar abscesses. Swelling: 1+ on the right. 1+ on the left.  Eyes:     Conjunctiva/sclera: Conjunctivae normal.     Pupils: Pupils are equal, round, and reactive to light.  Neck:     Musculoskeletal: Normal range of motion and neck supple.     Thyroid: No thyromegaly.  Cardiovascular:     Rate and Rhythm: Normal rate and regular rhythm.     Heart sounds: Normal heart sounds.  Pulmonary:     Effort: Pulmonary effort is normal.     Breath sounds: Normal breath sounds.  Lymphadenopathy:      Cervical: No cervical adenopathy.  Skin:    General: Skin is warm and dry.  Neurological:     Mental Status: She is alert and oriented to person, place, and time.      UC Treatments / Results  Labs (all labs ordered are listed, but only abnormal results are displayed) Labs Reviewed  POCT RAPID STREP A (OFFICE)  POCT MONO SCREEN ([facility])    EKG None  Radiology No results found.  Procedures Procedures (including critical care time)  Medications Ordered in UC Medications - No data to display  Initial Impression / Assessment and Plan / UC Course  I have reviewed the triage vital signs and the nursing notes.  Pertinent labs & imaging results that were available during my care of the patient were reviewed by me and considered in my medical decision making (see chart for details).     Non toxic, afebrile. Waxing and waning sore throat for a month now. Negative strep and mono today. Will try 5 day course of prednisone as well as omeprazole to see if this is helpful with symptoms. If no improvement or persistent recommend follow up with PCP and/or ent for recheck and further eval. Patient verbalized understanding and agreeable to plan.   Final Clinical Impressions(s) / UC Diagnoses   Final diagnoses:  Sore throat     Discharge Instructions     Negative for mono and strep which is reassuring.  Your vitals are stable, without fevers, which is also reassuring.  We will try a few treatment options to see if your symptoms improve.  5 days of prednisone which can help with swelling. Daily nasal spray which also helps with swelling.  Daily omeprazole as well, as sometimes reflux symptoms can lead to sore throat.  Please follow up with your primary care provider and/or ENT if symptoms do not improve in the next week.     ED Prescriptions    Medication Sig Dispense Auth. Provider   fluticasone (FLONASE) 50  MCG/ACT nasal spray Place 1 spray into both nostrils daily. 16 g Augusto Gamble B, NP   predniSONE (DELTASONE) 20 MG tablet Take 2 tablets (40 mg total) by mouth daily with breakfast for 5 days. 10 tablet Augusto Gamble B, NP   omeprazole (PRILOSEC) 20 MG capsule Take 1 capsule (20 mg total) by mouth daily. 30 capsule Zigmund Gottron, NP     Controlled Substance Prescriptions [place] Controlled Substance Registry consulted? Not Applicable   Zigmund Gottron, NP 03/10/18 1221

## 2019-02-05 IMAGING — CT CT CHEST W/O CM
2 of 3 series · 15 of 36 positions shown, 18 images · non-contrast
Comparison: Chest radiograph October 16, 2017

CLINICAL DATA: Persistent left-sided chest pain

EXAM:
CT CHEST WITHOUT CONTRAST
TECHNIQUE: Multidetector CT imaging of the chest was performed following the
standard protocol without IV contrast.

[Series 2: thorax · axial · 0.73mm/px · z∈[-178,+108]mm · 12 of 169 slices shown, 15 images]
[im 13/169  mediastinal]
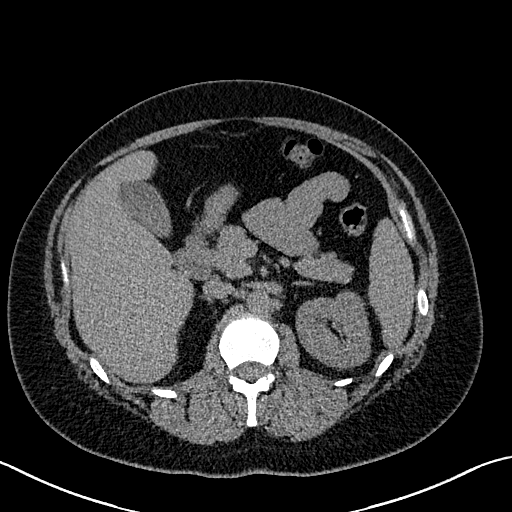
[im 13/169  lung]
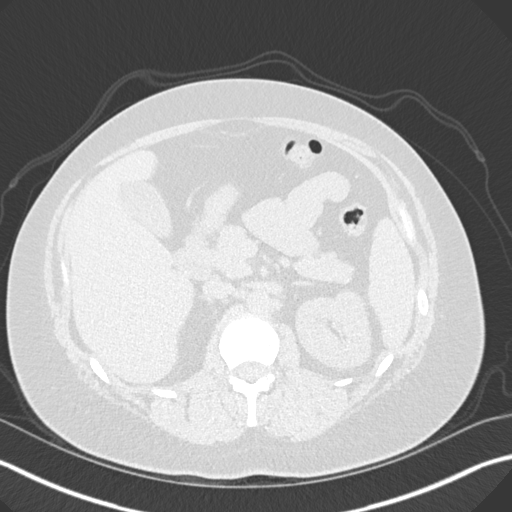
[im 25/169  lung]
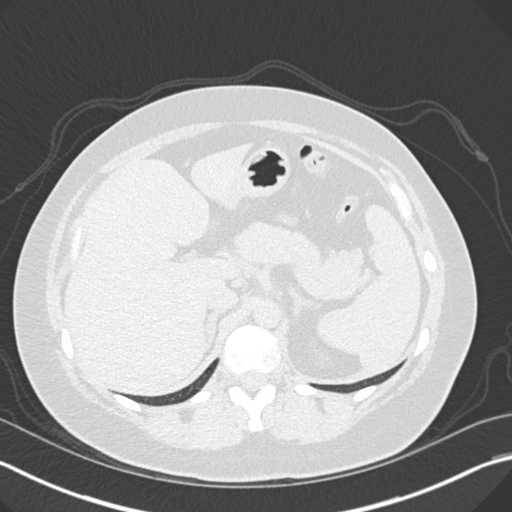
[im 38/169  lung]
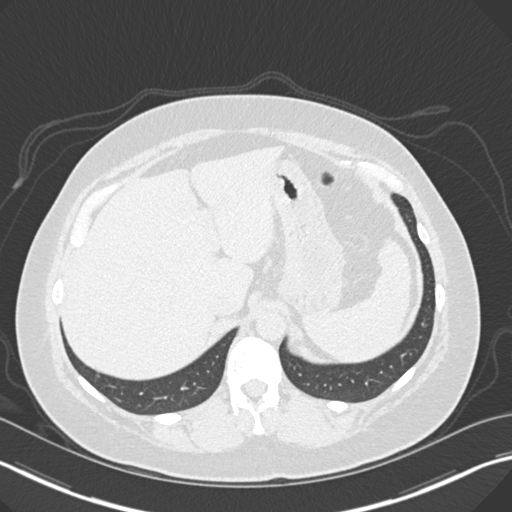
[im 50/169  lung]
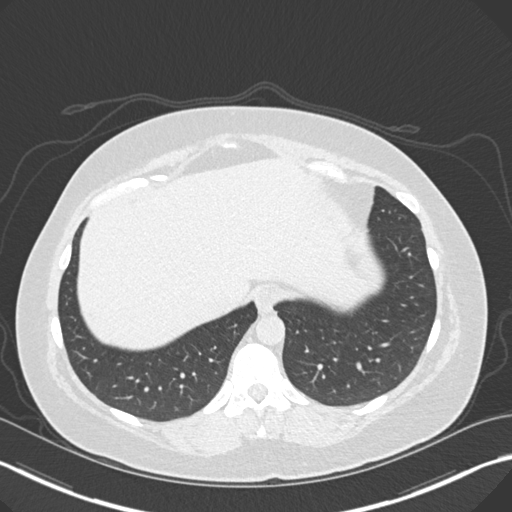
[im 63/169  mediastinal]
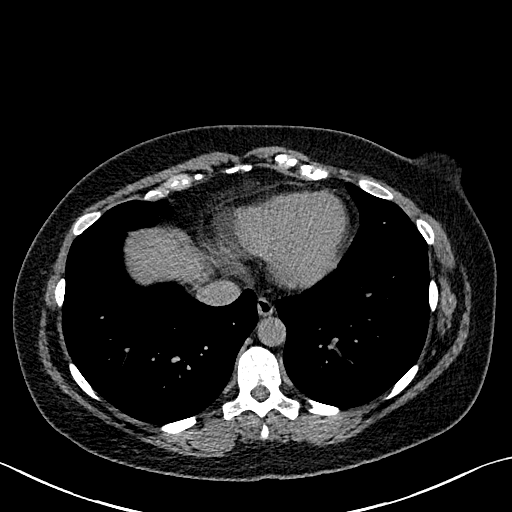
[im 63/169  lung]
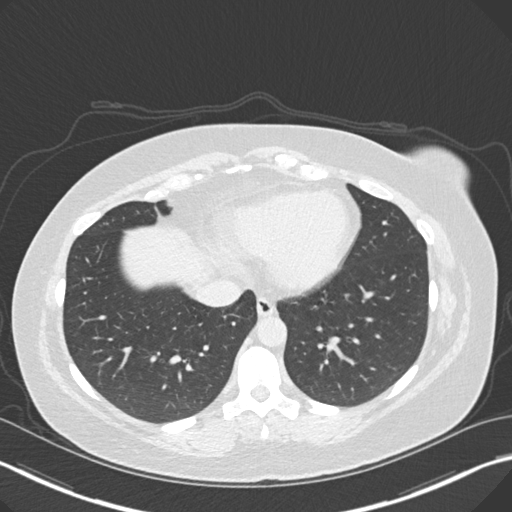
[im 75/169  lung]
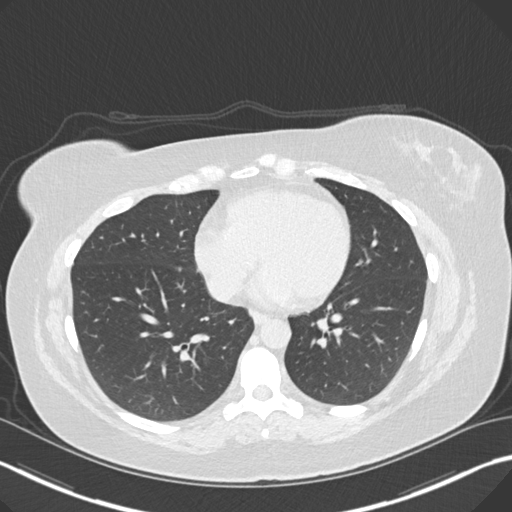
[im 94/169  lung]
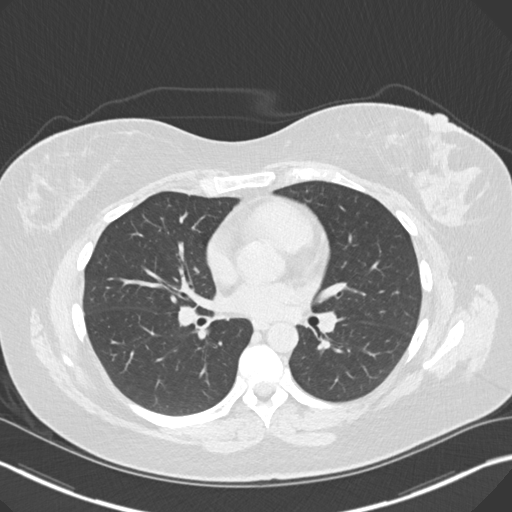
[im 106/169  lung]
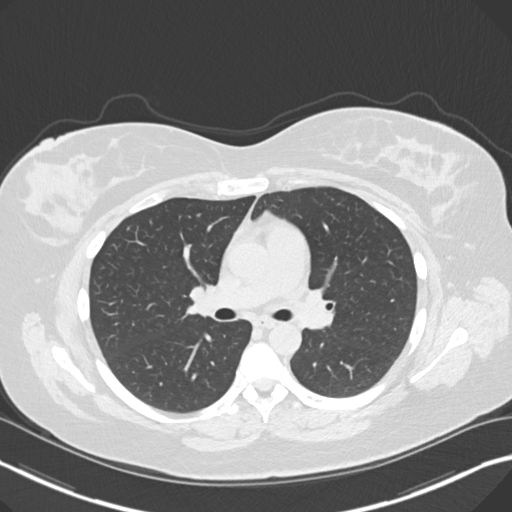
[im 119/169  mediastinal]
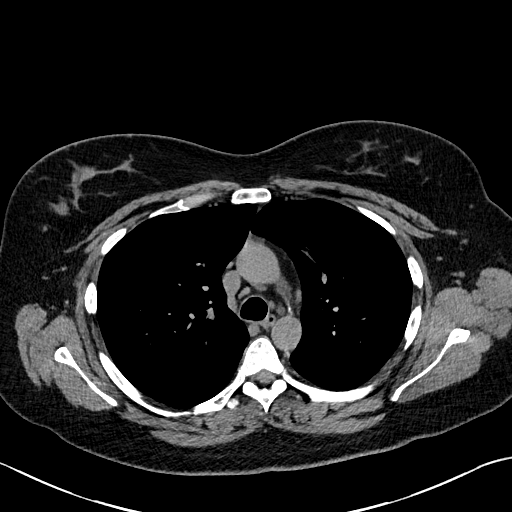
[im 119/169  lung]
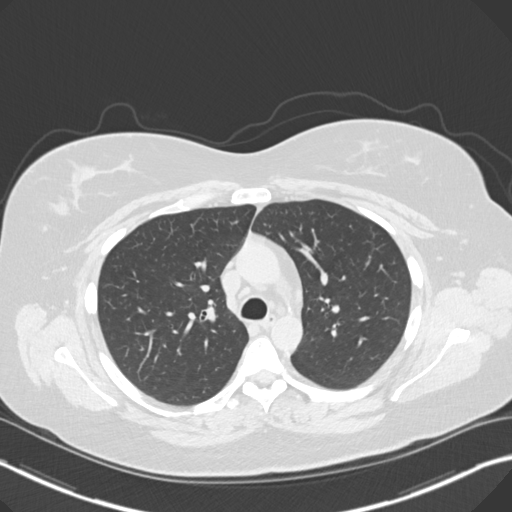
[im 131/169  lung]
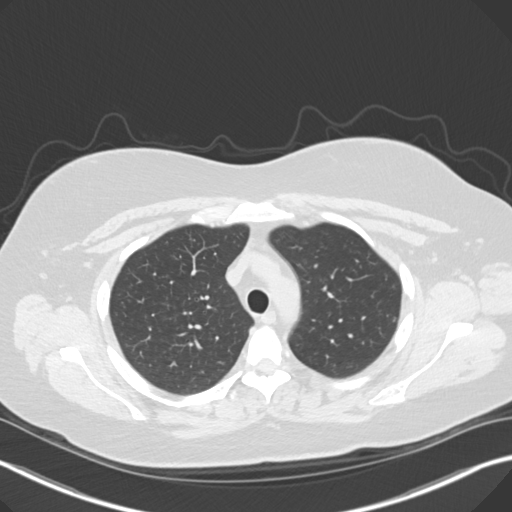
[im 144/169  lung]
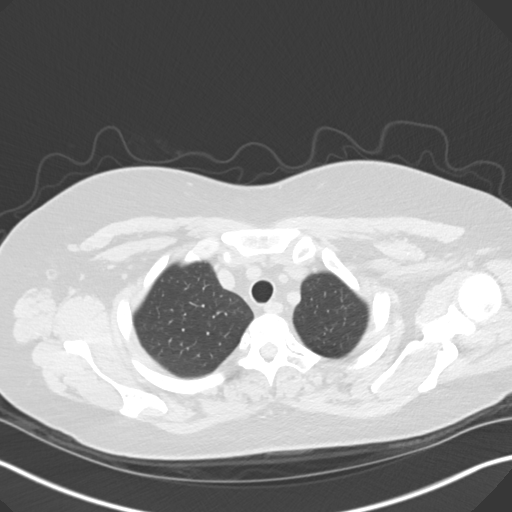
[im 156/169  lung]
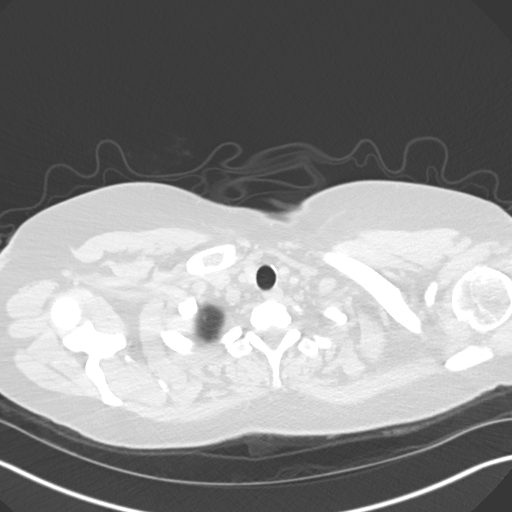

[Series 6: coronal · coronal · 0.67mm/px · 3 of 137 slices shown]
[im 28/137  lung]
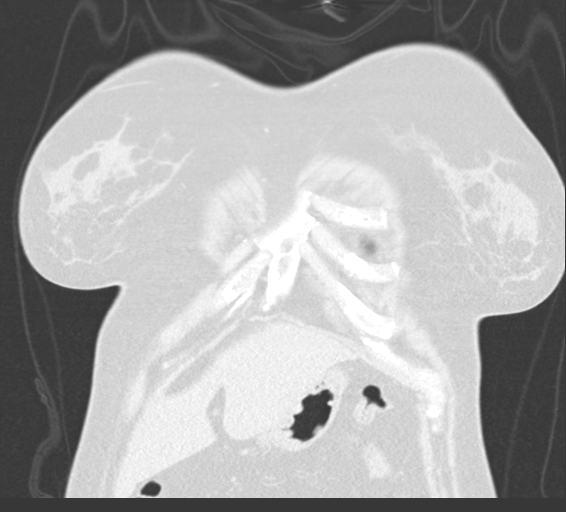
[im 55/137  lung]
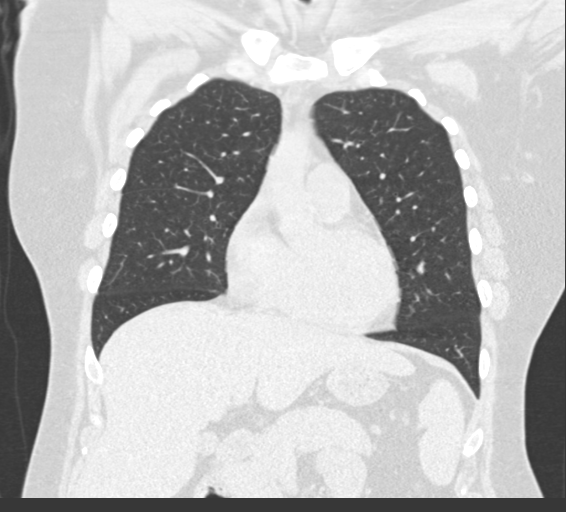
[im 82/137  lung]
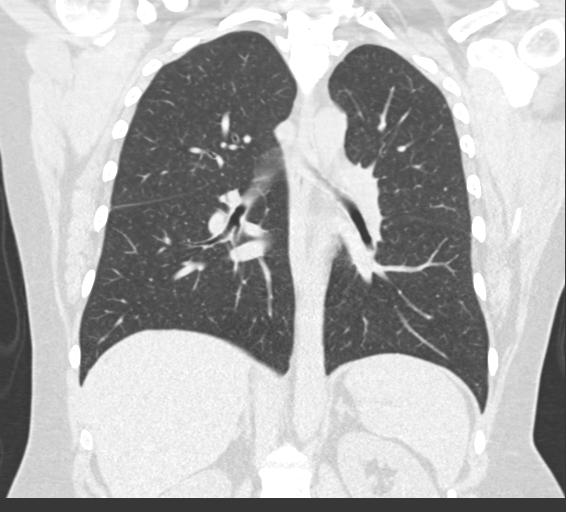

[15 of 36 positions shown; findings below may reference images not displayed]

FINDINGS: Cardiovascular: There is no appreciable thoracic aortic aneurysm.
Visualized great vessels appear unremarkable on this noncontrast
enhanced study. No pericardial effusion or pericardial thickening is
evident.

Mediastinum/Nodes: Visualized thyroid appears. There are scattered
subcentimeter mediastinal lymph nodes. There is no adenopathy by
size criteria in the thoracic region. No esophageal lesions are
evident.

Lungs/Pleura: No evident pneumothorax. Lungs are clear. No evident
pleural effusion or pleural thickening.

Upper Abdomen: Visualized upper abdominal structures appear normal.

Musculoskeletal: There are no blastic or lytic bone lesions. No
evident fracture or dislocation. No chest wall lesions are evident.
IMPRESSION: 1.  Lungs clear.

2.  No evident thoracic adenopathy.

3.  No evident bone lesions or chest wall lesions evident.

Comment: A cause for patient's symptoms has not been established
with this study.

## 2020-05-23 ENCOUNTER — Other Ambulatory Visit: Payer: Self-pay

## 2020-05-23 ENCOUNTER — Emergency Department
Admission: EM | Admit: 2020-05-23 | Discharge: 2020-05-23 | Disposition: A | Discharge status: Home / Self Care | Payer: Self-pay | Source: Home / Self Care

## 2020-05-23 DIAGNOSIS — H9201 Otalgia, right ear: Secondary | ICD-10-CM

## 2020-05-23 DIAGNOSIS — J069 Acute upper respiratory infection, unspecified: Secondary | ICD-10-CM

## 2020-05-23 MED ORDER — AMOXICILLIN-POT CLAVULANATE 875-125 MG PO TABS: 1.0000 | ORAL_TABLET | Freq: Two times a day (BID) | ORAL | 0 refills | Status: AC

## 2020-05-23 MED ORDER — TRAMADOL HCL 50 MG PO TABS: 50.0000 mg | ORAL_TABLET | Freq: Four times a day (QID) | ORAL | 0 refills | Status: AC | PRN

## 2020-05-23 MED ORDER — FLUTICASONE PROPIONATE 50 MCG/ACT NA SUSP: 1.0000 | Freq: Every day | NASAL | 0 refills | Status: AC

## 2020-05-23 NOTE — Discharge Instructions (Addendum)
Drink lots of fluids Use a salt water nasal spray to rinse out your sinuses Use the Flonase twice a day until you see improvement, then once a day through allergy season May add Mucinex if needed to loosen mucus Use a humidifier if you have 1 Take the antibiotic 2 times a day until gone Take pain medicine if needed

## 2020-05-23 NOTE — ED Triage Notes (Signed)
Pt c/o LT ear pain that started this afternoon. Says her RT ear has felt "clogged" since Monday. Denies fever.  Pain 5/10 Peroxide rinses and flonase prn.
